# Patient Record
Sex: Female | Born: 1950 | Race: White | Smoking: Former smoker
Health system: Northeastern US, Academic
[De-identification: ages and names within clinical notes are randomized; demographics above are authoritative.]

## PROBLEM LIST (undated history)

## (undated) DIAGNOSIS — M1990 Unspecified osteoarthritis, unspecified site: Secondary | ICD-10-CM

## (undated) DIAGNOSIS — R32 Unspecified urinary incontinence: Secondary | ICD-10-CM

## (undated) DIAGNOSIS — J449 Chronic obstructive pulmonary disease, unspecified: Secondary | ICD-10-CM

## (undated) DIAGNOSIS — K219 Gastro-esophageal reflux disease without esophagitis: Secondary | ICD-10-CM

## (undated) DIAGNOSIS — J45909 Unspecified asthma, uncomplicated: Secondary | ICD-10-CM

## (undated) HISTORY — DX: Unspecified osteoarthritis, unspecified site: M19.90

## (undated) HISTORY — PX: OTHER SURGICAL HISTORY: SHX169

## (undated) HISTORY — PX: TUBAL LIGATION: SHX77

## (undated) HISTORY — PX: DENTAL SURGERY: SHX609

## (undated) HISTORY — PX: KNEE SURGERY: SHX244

## (undated) HISTORY — DX: Gastro-esophageal reflux disease without esophagitis: K21.9

## (undated) HISTORY — DX: Unspecified urinary incontinence: R32

## (undated) HISTORY — PX: TONSILLECTOMY: SHX28A

## (undated) HISTORY — PX: LUNG REMOVAL, PARTIAL: SHX233

---

## 2009-09-18 HISTORY — PX: FOOT SURGERY: SHX648

## 2011-09-19 HISTORY — PX: ANKLE SURGERY: SHX546

## 2016-03-13 ENCOUNTER — Other Ambulatory Visit: Payer: Self-pay | Admitting: Gastroenterology

## 2016-03-16 ENCOUNTER — Encounter: Payer: Self-pay | Admitting: Gastroenterology

## 2016-03-16 ENCOUNTER — Telehealth: Payer: Self-pay | Admitting: Pulmonology

## 2016-03-16 NOTE — Telephone Encounter (Signed)
Forwarding to provider to advise if he will see new patient.

## 2016-03-16 NOTE — Telephone Encounter (Signed)
The office of Dr. Rennis HardingEllis is referring  Ms. Romanello for a  abnormal CT scan. Dr. Rennis HardingEllis is requesting the Patient see Dr.Levy.  The patient has been [facility] the [facility] number and [facility] fax number to give to the referring PCP office.  [facility].     The patient's medical records     Will be faxed? yes                                                                                                                                    Dr. Rennis HardingEllis' office can be reached if necessary at [phone].  Patient can be reached at [phone].    REGISTRATION INSURANCE MANAGEMENT  This registration/insurance needs to be updated for the Pulmonary  department.  Please call the patient at [phone] .

## 2016-03-17 ENCOUNTER — Telehealth: Payer: Self-pay

## 2016-03-17 NOTE — Telephone Encounter (Signed)
[facility] will fax report for only chest Xray and share on [facility].

## 2016-03-20 LAB — HM DEXA SCAN

## 2016-03-20 NOTE — Telephone Encounter (Signed)
Patient confirmed NPV on 04/04/16 at 11:00 am, with Dr. Shawnee KnappLevy.

## 2016-03-22 ENCOUNTER — Telehealth: Payer: Self-pay

## 2016-03-22 NOTE — Telephone Encounter (Deleted)
Medical records received from [facility] and sent to scanning.

## 2016-03-22 NOTE — Telephone Encounter (Signed)
Medical records received from [facility] and sent to scanning.

## 2016-03-24 NOTE — Telephone Encounter (Signed)
Mailed new patient paperwork to the patient.

## 2016-03-29 LAB — LIPID PANEL
Chol/HDL Ratio: 3.3
Cholesterol: 239 mg/dL — ABNORMAL HIGH
HDL: 72 mg/dL — ABNORMAL HIGH
LDL Calculated: 142 mg/dL — ABNORMAL HIGH
Non HDL Cholesterol: 167 mg/dL — ABNORMAL HIGH
Triglycerides: 123 mg/dL

## 2016-03-29 LAB — HEMOGLOBIN A1C: Hemoglobin A1C: 5.9 %

## 2016-04-03 ENCOUNTER — Other Ambulatory Visit
Admission: RE | Admit: 2016-04-03 | Discharge: 2016-04-03 | Disposition: A | Discharge status: Home / Self Care | Payer: Self-pay | Source: Ambulatory Visit | Attending: Gastroenterology | Admitting: Gastroenterology

## 2016-04-03 ENCOUNTER — Encounter: Payer: Self-pay | Admitting: Gastroenterology

## 2016-04-04 ENCOUNTER — Ambulatory Visit: Payer: Self-pay | Admitting: Pulmonology

## 2016-04-04 ENCOUNTER — Telehealth: Payer: Self-pay

## 2016-04-04 ENCOUNTER — Other Ambulatory Visit: Payer: Self-pay

## 2016-04-04 ENCOUNTER — Ambulatory Visit: Payer: Self-pay

## 2016-04-04 VITALS — BP 138/79 | HR 94 | Temp 97.5°F | Resp 18 | Ht 61.0 in | Wt 181.0 lb

## 2016-04-04 DIAGNOSIS — J449 Chronic obstructive pulmonary disease, unspecified: Secondary | ICD-10-CM

## 2016-04-04 DIAGNOSIS — R059 Cough, unspecified: Secondary | ICD-10-CM

## 2016-04-04 NOTE — Telephone Encounter (Signed)
Patient is scheduled for [facility] on Thursday April 13, 2016 at [facility].  [facility]

## 2016-04-04 NOTE — Patient Instructions (Addendum)
We should get a Chest CT to evaluate the cystic area of your lungs.    Your breathing tests are normal    You can stop the spiriva and use albuterol as needed for the time being.    I will call you---[phone]---once I have reviewed the CT and we can talk about next steps.      Appointment 2 weeks following CT of the chest so we can review together    I will mention your PPD to Dr. Rennis HardingEllis regarding treatment options.

## 2016-04-05 NOTE — Progress Notes (Signed)
Patient:  Brianna Barnett  MRN:  [id number]    Pulmonary Clinic Note:   Hetty Ely, M.D.    Dear Dr. Rennis Harding:    Thank you for asking me to evaluate Brianna Barnett who was interviewed and examined in the Pulmonary Consultative Clinic on April 04, 2016.  As you know she is a 65 year old woman who has generally had good health.  Over the past six months she recalls having episodes of bronchitis three of four times.  She was on antibiotics, inhalers and even a course of Prednisone.  These were discreet episodes all piled up in the past six months that eventually led to obtaining a chest x-ray.  She was found to have a grapefruit size cystic area at the base of her right lung which prompted consultation.  She does not have a prior history of recurrent pneumonia or infections over the past ten years.  Even in infancy there were no special health problems.  She is not sure about fever but does report recent night sweats.  Her weight has generally gone up and down but over the past five years or so she has picked-up a good 15 pounds or more.  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  Her grandmother died of Tuberculosis.  She recalls being segregated from her classroom when she was a child for a brief period of time but has never received treatment for latent infection.      Her past medical history is notable for heartburn and reflux type symptoms.  In fact, she just underwent endoscopy and was found to have some level of stricture that required dilatation.  There is no history of hypertension, asthma, COPD or significant medical disease.  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  No major environmental or occupational exposures.  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  She smoked cigarettes at the rate of about a pack a day for 30 years.  She quit 16 years ago.  She now lives alone.    Her family history shows that her mother had arthritis, COPD and heart disease.  No other significant family history was reported.    Her review of systems shows her recent weight gain over the past five years or more.  Night sweats to the point of having to change her gown.  She has some dyspnea and cough.  She also reports some calf pain on walking.  Otherwise her review of systems is generally unremarkable.  She reported some level of chest discomfort associated with the bronchitis episodes.    On my exam her blood pressure measured 138/79 with a resting heart rate of 94 and an oxygen saturation of 95% on room air.  Her weight today was 181 pounds and her BMI 34.3.  She was afebrile.  Her head and neck exam showed no scleral icterus or injection.  No sinus symptoms were reported as far as pain or pressure.  She had no cervical or supraclavicular adenopathy.  Her lungs showed good breath sounds bilaterally.  She was pretty symmetric both right and left.  No wheezing, rhonchi or rales.  Cardiac tones were regular.  S2 was not increased.  Her JVP was normal.  She had no abdominal distention or lower extremity swelling.  She had no joint deformities, rash or clubbing.    Her spirometry shows a very mildly reduced FEV1 at 1.6 liters (76% of predicted).  Her vital capacity was 80% of predicted at 2.22 liters.  The FEV1/FVC ratio was normal at 72%.  These studies are essentially normal except for the mild reduction in her FEV1.  No clear evidence for obstructive or restrictive disease.  Her low/ normal vital capacity may be tied in with her obesity/habitus.    I reviewed her chest x-ray from 03/13/16 but importantly also found an old chest x-ray from 01/06/11.  The cystic area at the base of her right lung which is about the size of a grapefruit was present back in 2012 but  is much larger now.  The film from 2012 was interpreted as possibly showing an accessory fissure but clearly in retrospect the cystic area was present five years ago.  It was about the size of a peach at that time.  There are no other parenchymal abnormalities, no effusions.  No hilar adenopathy or mass.       In summary Brianna Barnett is a 65 year old woman with a six month history of recurrent cough and bronchitis type symptoms.  Her chest x-ray shows a very thin-walled cystic structure at the base of her right lung that has expanded significantly over the past five years.  This has an appearance of a pneumatocele but given its location at the lower pole of the right hilum it is also possible that this is tied in with a bronchogenic cyst or perhaps post-obstructive emphysema from endobronchial disease.  Since it has been present for five years with cystic dilatation but no evidence of mass I think it is unlikely that we are dealing with an endobronchial carcinoid/carcinoid tumor or for that matter a primary pulmonary malignancy.  Likely this might be congenital or an expanding pneumatocele/ball-valve phenomenon.      To better define things and decide on further management she should have a CAT scan performed.  She may need bronchoscopy as well.  Very likely this should be removed given the four episodes of severe bronchitis over the past several months.  There is also the risk that this could rupture and cause pneumothorax.      Ill arrange for a chest CT scan and have her back in the office for review.  Ill also review this situation with our thoracic group as likely this will need to be surgically approached.  Thank you for involving me in her care.  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  Since she never received treatment for latent infection she would be a good candidate for nine months of Isoniazid or four months of Rifampin.  We can address this down the road.  Usually  this is most easily managed by having her go to the [facility] as all of the medications would be free as well as any monitoring for side effects.     Sincerely,        Vic RipperPaul C. Shawnee KnappLevy, M.D.

## 2016-04-10 LAB — SURGICAL PATHOLOGY

## 2016-04-18 DIAGNOSIS — R7611 Nonspecific reaction to tuberculin skin test without active tuberculosis: Secondary | ICD-10-CM

## 2016-04-18 HISTORY — DX: Nonspecific reaction to tuberculin skin test without active tuberculosis: R76.11

## 2016-05-02 ENCOUNTER — Ambulatory Visit: Payer: Self-pay

## 2016-05-02 ENCOUNTER — Ambulatory Visit: Payer: Self-pay | Admitting: Pulmonology

## 2016-05-02 ENCOUNTER — Encounter: Payer: Self-pay | Admitting: Pulmonology

## 2016-05-02 VITALS — BP 141/78 | HR 96 | Resp 16 | Ht 62.0 in | Wt 182.5 lb

## 2016-05-02 DIAGNOSIS — J984 Other disorders of lung: Secondary | ICD-10-CM

## 2016-05-02 DIAGNOSIS — R7611 Nonspecific reaction to tuberculin skin test without active tuberculosis: Secondary | ICD-10-CM

## 2016-05-02 NOTE — Patient Instructions (Signed)
I will call Dr. Yetta BarreJones re: appt.    Avoid air travel if possible until pneumatocele is removed.      Should you wish treatment for your +PPD you can [facility].    Follow up as needed.

## 2016-05-03 NOTE — Progress Notes (Signed)
Patient:  Brianna Barnett  MRN:  [id number]    Pulmonary Clinic Note:   Hetty ElyJosephine Ellis, M.D.    Dear Dr. Rennis HardingEllis:    Brianna Braveheresa Pile was re-evaluated in the Pulmonary Clinic on May 02, 2016.  She had the follow-up chest CT scan to better define the cystic lesion in her right lower lobe.  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  Although she doesnt have the classic pneumonia that preceded its development it certainly has that appearance on imaging studies.  I doubt we are dealing with a bronchogenic cyst.    On my exam her vital signs are listed in the electronic record and are normal.  Her lungs are clear.    I reviewed the CT scan with Brianna Barnett and her daughter.  I think there is very little likelihood that we are dealing with an endobronchial lesion that might have caused a ball valve effect.  Ive told Brianna Barnett that I would be referring her to Dr. Barrett Henlearolyn Valeria to be considered for surgical resection.  If necessary, bronchoscopy can be done at the time of her operative procedure.    Lastly, Brianna Barnett asked me about her prior history of a positive PPD.  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  Ive suggested that if she is interested in receiving treatment for latent infection, she would be referred [facility] as the medication and its monitoring could be done as part of the [facility] TB Program.    I did not schedule her for a follow-up visit but would be happy to see her down the road if issues come up.  Ive reassured her that I do not think any of this is tied in with an underlying growth in the lung or endobronchial lesion but because of the risks of the enlarging pneumatocele that was present back in 2012 it would make sense to get this removed.  I also advised her to avoid air travel for the time being until this is dealt with.    Sincerely,        Vic RipperPaul C. Shawnee KnappLevy, M.D.

## 2016-05-04 ENCOUNTER — Ambulatory Visit: Payer: Self-pay | Admitting: Thoracic/Foregut Surgery

## 2016-05-10 ENCOUNTER — Ambulatory Visit: Payer: Self-pay | Admitting: Thoracic/Foregut Surgery

## 2016-05-10 ENCOUNTER — Encounter: Payer: Self-pay | Admitting: Thoracic/Foregut Surgery

## 2016-05-10 VITALS — BP 157/80 | HR 98 | Temp 97.1°F | Resp 19 | Ht 62.0 in | Wt 185.0 lb

## 2016-05-10 DIAGNOSIS — J984 Other disorders of lung: Secondary | ICD-10-CM

## 2016-05-10 NOTE — Preop H&P (Signed)
]      ROS ] 

## 2016-05-10 NOTE — H&P (Addendum)
05/10/2016    RE: Brianna Barnett  DOB: 09/29/1950  MRN: [id number]    Dear Drs. Marcelino ScotEllis and Levy,      Today, I had the pleasure of meeting Brianna Barnett in the Thoracic and Foregut surgery clinic in consultation for cystic lesion of the right lung.      As you are aware,  Brianna Barnett is a 65 y.o. female with a 30 pack year smoking history that ended 15 years ago, arthritis, GERD and history of numerous episodes of pneumonia and bronchitis who was referred to our clinic for CT finding of a cystic structure in the right lower lobe concerning for bullous. Per Brianna Barnett, Brianna Barnett has had three bouts of bronchitis in the last 6 months for which Brianna Barnett was found to have a right lower lobe cyst on CXR.  Brianna Barnett was then referred to Dr. Shawnee KnappLevy, CT of the chest was performed and it showed a over 10 cm cyst in the right lower lobe.     Brianna Barnett endorses subjective shortness of breath in the last several months, often has productive cough in this time period. Brianna Barnett denies hemoptysis and wheezing.       PAST MEDICAL  HISTORY:  Past Medical History:   Diagnosis Date    Arthritis     GERD (gastroesophageal reflux disease)        PAST SURGERY HISTORY:  Past Surgical History:   Procedure Laterality Date    EYE SURGERY      KNEE SURGERY         ALLERGIES AND MEDICATIONS  Allergies   Allergen Reactions    Cat Dander Shortness Of Breath and Itching    Sulfa Antibiotics Hives, Itching and Nausea And Vomiting       Current Outpatient Prescriptions   Medication    PROAIR HFA 108 (90 BASE) MCG/ACT inhaler    amitriptyline (ELAVIL) 50 MG tablet     No current facility-administered medications for this visit.        SOCIAL HISTORY  Social History     Social History    Marital status: Divorced     Spouse name: N/A    Number of children: N/A    Years of education: N/A     Social History Main Topics    Smoking status: Former Smoker     Packs/day: 0.75     Years: 37.00     Types: Cigarettes    Smokeless tobacco: Never Used    Alcohol use None      Drug use: None    Sexual activity: Not Asked     Other Topics Concern    None     Social History Narrative       FAMILY HISTORY  No family history on file.    REVIEW OF SYSTEMS:  Review of Systems   Constitutional: Negative for chills, diaphoresis, fever, malaise/fatigue and weight loss.   HENT: Negative.    Eyes: Negative.    Respiratory: Positive for cough, sputum production and shortness of breath. Negative for hemoptysis and wheezing.    Cardiovascular: Negative.    Gastrointestinal: Negative.    Genitourinary: Negative.    Musculoskeletal: Negative.    Skin: Negative.    Neurological: Negative for weakness.   Psychiatric/Behavioral: Negative.           PHYSICAL EXAM:   height is 1.575 m (5\' 2" ) and weight is 83.9 kg (185 lb). Brianna Barnett temporal temperature is 36.2 C (97.1 F). Brianna Barnett blood pressure is 157/80 and  Brianna Barnett pulse is 98. Brianna Barnett respiration is 19 and oxygen saturation is 96%.   Pain Score:0    Brianna Barnett is alert and oriented. Brianna Barnett face is symmetric, sclerae anicteric, and Brianna Barnett pupils are equal, round, reactive to light bilaterally.  Brianna Barnett oropharyngeal mucosa is pink and moist.  Brianna Barnett has no palpable cervical or supraclavicular lymphadenopathy.  Brianna Barnett lungs are clear to auscultation bilaterally, without any rhonchi, rales, or wheezes.  Brianna Barnett heart rate and rhythm are regular, without any murmurs.  Brianna Barnett abdomen is soft and nondistended, without any obvious hepatosplenomegaly.  Brianna Barnett lower extremities are warm and without any edema.       RECORD REVIEW/IMAGING:  Chest CT on 04/13/16    FINDINGS:  There is a large right lower lobe pneumatocele measuring   8.7 cm on image 63. Small scattered bullae are seen within the upper   lung zones. There are no pleural effusions.    There are no enlarged mediastinal or axillary lymph nodes. There is   atherosclerotic disease of the thoracic aorta.    Scans of the visualized upper abdominal contents are unremarkable.    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    PFTs            IMPRESSION/PLAN  In summary, Brianna Barnett is a 65 y.o. female with past history of 30 pack year smoking, arthritis, GERD who was referred to my clinic for a large bulla in the right lower lobe in the setting of multiple episodes of bronchitis in the last few months.  The bulla has grown in size compared to the chest film from 2012.  Upon reviewing of Brianna Barnett CT scan from July, the patient has one other bulla in the right upper lobe. 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      I have discussed the option of surgical resection of the bullae via a VATS.  I reviewed the risks and recovery of surgery.   We would need a complete set of PFTs and will see the patient back after those studies    Thank you for the referral. Should you have any questions, please feel free to contact me at any time.      I saw and evaluated the patient. I have reviewed and edited the resident's/fellow's note and confirm the findings and plan of care as documented above.    Barrett HenleAROLYN [place], MD      Respectfully yours,    Barrett Henlearolyn [place], M.D.  Associate Professor of Surgery  Division of Thoracic / Foregut Surgery  [facility]    [address]  [place], [place] [id number]  Office: [phone]  Fax: [phone]

## 2016-05-11 NOTE — Invasive Procedure Plan of Care (Signed)
Invasive Procedure Plan of Care (Consent Form 419):   Condition(s) Addressed: Right Bullae of lung   Person Performing Procedure: [place], Sabel Hornbeck   Side: Right    Procedure: Bronchoscopy, right VATS wedge resection    Special Equipment:    Planned Anesthesia: General   Benefits: Removal of bullae   Risks: Infection, bleeding, damage to adjacent structures, air leak, risks of anesthesia     Alternatives: No procedure   Expected Length of Stay: 1 day(s)   Pt Decision: Agrees to proceed     ----------------------------------------------------------------------------------------------------------------------------------------  Consent:  I hereby give my consent and authorize [place], Sunny Gains  (The list of possible assistants, all of whom are privileged to provide surgical services at the hospital, is available)  To treat the following: Right Bullae of lung  Procedure includes: Bronchoscopy, right VATS wedge resection   1 The care provider has explained my condition to me, the benefits of having the above treatment procedure, and alternate ways of treating my condition. I understand that no guarantees have been made to me about the result of the treatment. The alternatives to this procedure include: No procedure   2 The care provider has discussed with me the reasonably foreseeable risks of the treatment and that there may be undesirable results. The risks that are specifically related to this procedure include: Infection, bleeding, damage to adjacent structures, air leak, risks of anesthesia     3 I understand that during the treatment a condition may be discovered which was not known before the treatment started. Therefore, I authorize the care provider to perform any additional or different treatment which is thought necessary and available.   4 Any tissue, parts, or substances removed during the procedure may be retained or disposed of in accordance with customary scientific, educational and clinical practice.   5  Vendor information if appropriate:      6 Patient Consent for Medical or Surgical Procedure: I have carefully read and fully understand this informed consent form, and have had sufficient opportunity to discuss my condition and the above procedure(s) with the care provider and his/her associates, and all of my questions have been answered to my satisfaction.    Agrees to proceed       ____________________________________________________   _______________   Signature of Patient  Date/Time     ____________________________________________________   _______________   Signature of Parent or Legal Guardian  (if Patent is unable to sign or is a minor) Date/Time       Complete this section for all OR procedures and all other invasive internal procedures performed in any setting.  7 Consent for Receipt of Tissue(s): Not expected to be needed but may be required and given in an emergency   8 For those procedures that have the potential for significant blood loss: transfusion is not expected to be needed but may be required and given in an emergency, I consent to the transfusion of blood or blood components that may be necessary before, during or after the procedure. I have been informed that no transfusion is 100% safe, however present testing methods make the risks of infection very small. Risks include infection from viruses, bacteria, or parasites, including but not limited to HIV (the AIDS virus) and hepatitis, as well as fever, chills, allergy, volume overload, or death. I have discussed possible alternatives with my care provider, including no transfusion, autologous transfusion (donation of my own blood), designated/directed donor transfusion (collection of blood from donors selected by me) or blood salvage  during the procedure. I understand that these alternatives may not be available due to timing or health reasons, and the above risks may still apply.   9 Patient Consent for Blood/Tissue: I have had a chance to  discuss the risks, benefits and alternatives regarding transfusion/receipt of tissue (as above) with my healthcare provider. My decision(s) regarding the transfusion of blood or blood components and/or the receipt of tissue are as above. I understand this covers my perioperative/periprocedural (before, during, and after the surgery/procedure) course of treatment.    Patient sign here unless 7 and 8 do not apply.       ____________________________________________________   _______________   Signature of Patient Date/Time     ____________________________________________________   _______________   Signature of Parent or Legal Guardian  (if Patent is unable to sign or is a minor) Date/Time      I have discussed the planned procedure, including the potential for any transfusion of blood products or receipt of tissue as necessary, expected benefits, the potential complications and risks and possible alternatives and their benefits and risks with the patient or the patient's surrogate. In my opinion, the patent or the patient's surrogate understands the proposed procedure, its risks, benefits, and alternatives.    Electronically signed by Barrett Henle, MD at 1:46 PM

## 2016-05-29 ENCOUNTER — Ambulatory Visit: Payer: Self-pay

## 2016-05-29 DIAGNOSIS — J984 Other disorders of lung: Secondary | ICD-10-CM

## 2016-06-02 ENCOUNTER — Encounter: Payer: Self-pay | Admitting: Thoracic/Foregut Surgery

## 2016-06-06 ENCOUNTER — Encounter (INDEPENDENT_AMBULATORY_CARE_PROVIDER_SITE_OTHER): Payer: Self-pay

## 2016-06-06 ENCOUNTER — Encounter: Payer: Self-pay | Admitting: Nurse Practitioner

## 2016-06-06 DIAGNOSIS — J984 Other disorders of lung: Secondary | ICD-10-CM

## 2016-06-06 DIAGNOSIS — K219 Gastro-esophageal reflux disease without esophagitis: Secondary | ICD-10-CM | POA: Insufficient documentation

## 2016-06-06 DIAGNOSIS — M1990 Unspecified osteoarthritis, unspecified site: Secondary | ICD-10-CM | POA: Insufficient documentation

## 2016-06-06 HISTORY — DX: Other disorders of lung: J98.4

## 2016-06-06 NOTE — Anesthesia Preprocedure Evaluation (Addendum)
Anesthesia Pre-operative History and Physical for Brianna Barnett    ______________________________________________________________________________________  History and Physical Completed at [facility]  Summary:  Brianna Barnett presents preoperatively for anesthesia evaluation prior to THORACOSCOPY (VATS) Wedge (Right ) on 06/16/16 with Dr. Ronnald Ramp.   She has a medical history significant for:  other pulmonary insufficiency - followed by Dr. Hartford Poli (note in e-records from 04/04/16, letter 05/02/16).   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 Mitral sclerosis.    Nuclear stress 02/13/16:   1. Myocardial perfusion imaging reveals normal rest and stress images. No evidence of ischemia.  2. No chest pain or shortness of breath noted with pharmacologic stress.   3. Normal EKG response to pharmocologic stress. No evidence of ischemia. No arrhythmias.   4. Normal LV cavity size and systolic function. The calculated LVEF 74%.  5. Normal TID index.   Electrophysiology/AICD/Pacer:  EKG 06/07/16: sinus rhythm. Left axis deviation. Rate 93, PR 172.  Pulmonary Function Tests:  PFT's 05/29/16 : FEV1 1.79 (78 % Pred); FVC 2.46 (81 % Pred), FEV1/FVC 73 % (95 % Pred), DLCO 16.08 (76 % Pred). Normal pulmonary function tests. There has been no significant change from PFTs performed on 04/04/16.    By Pleas Koch, NP at 11:08 AM on 06/06/2016    <[email or web address]>  Anesthesia Evaluation Information Source: patient, records     ANESTHESIA  Pertinent(-):  history of anesthetic complications, Family Hx of Anesthetic  Complications    GENERAL    + Obesity (BMI 32.8)            central    + Contact Precautions (GM died of TB - pt has had no treatment - CXR negative)           TB  Pertinent (-):  substance abuse, history of anesthetic complications, Family Hx of Anesthetic Complications    HEENT    + Corrective Eyewear (reading, driving)            glasses  Pertinent (-):   hearing loss, TMJD, sinus issues, nosebleeds, neck pain  Comment: Detached retina OD in the past with repair   PULMONARY    + Smoker            former, remote history    + Pulmonary Testing    + Restrictive Lung Disease (bronchitis x 4 within year)    + Shortness of breath (+ SOB with 1 FOS - walking 1 mile per day)    + Pneumonia  Pertinent(-): recent URI, cough/congestion, environmental allergies, snoring    CARDIOVASCULAR  Good(4+METs) Exercise Tolerance    + Hypertension             well controlled           + Cardiac Testing (negative per pt)            nuclear stress test    + Dysrhythmias            palpitations, symptomatic  Pertinent(-):  past MI, angina, CAD, anticoagulants, CHF,  orthopnea (2 pillows at night and bed on an incline d/t GERD), DVT    GI/HEPATIC/RENAL  Last PO Intake: >8hr before procedure and >2hr before procedure (clears)    + GERD (on omeprazole)            well controlled    + Alcohol use            social  Pertinent(-):  hiatal hernia, PUD (GIB in 30s), nausea, vomiting, liver  issues, pancreatic issues, bowel issues, urinary issues NEURO/PSYCH    + Chronic pain (left ankle and knee)    + Psychiatric Issues (on elavil at hs)          anxiety  Pertinent(-):  headaches (when younger), dizziness/motion sickness, syncope, seizures, cerebrovascular event, gait/mobility issues    ENDO/OTHER  Pertinent(-):  diabetes mellitus, thyroid disease, chemo Hx, menstruating (post-menopause)    HEMALOGIC     Denies hematologic issues    + Arthritis (left knee)            knees and ankle/feet  Pertinent(-):  bruises/bleeds easily, coagulopathy,  anticoagulants, blood transfusion, blood dyscrasia, autoimmune disease       Physical Exam    Airway            Mouth opening: normal            Mallampati: II            TM distance (fb): >3 FB            Neck ROM: full  Dental        Cardiovascular  Normal Exam           Rhythm: regular           Rate: normal  No murmur    Neurologic    Normal Exam    General Survey    Normal Exam   Pulmonary   Normal Exam    breath sounds clear to auscultation    + Decreased breath sounds  Bilateral    No cough, rhonchi, wheezes, rales    Mental Status   Normal Exam    oriented to person, place and time     Patient Education from CPM Provider:  The following items were discussed with Brianna Barnett to his satisfaction and comprehension:  The facility's NPO guidelines were discussed  To call the surgeon if he becomes ill prior to surgery  All questions were answered  Transportation home: daughter  Medications DOS with sip of water: as per AVS  Hold medications AM day of surgery: as per AVS  Nurse reviewed additional items as indicated in the education record.  Brianna Barnett verbalized knowledge and teaching objectives met.  No barriers to learning identified.  Teaching sheet reviewed with patient/family.  IV insertion was reviewed with her.  The importance of coughing and deep breathing was emphasized.  She was instructed on the pain scale and pain management.  Brianna Barnett was instructed to continue ASA/NSAIDS before surgery.      ________________________________________________________________________  Plan  ASA Score  2  Anesthetic Plan general    Induction (RSI); General Anesthesia/Sedation Maintenance Plan (inhaled agents, propofol infusion and neuromuscular blockade);  Airway Manipulation (direct laryngoscopy); Airway (double lumen tube); Line ( use current access); Monitoring (standard ASA); Positioning (lateral decubitus); PONV Plan (dexamethasone and ondansetron); Pain (per surgical team); PostOp  (PACU)    Informed Consent     Risks:          Risks discussed were commensurate  with the plan listed above with the following specific points: N/V, aspiration and sore throat , damage to:(eyes, nerves, teeth), awareness, unexpected serious injury, allergic Rx, death    Anesthetic Consent:      Anesthetic plan (and risks as noted above) were discussed with patient    Blood products Consent:        Use of blood products discussed with: patient     Plan also discussed with team members including:  attending and resident    Attending Attestation:  As the primary attending anesthesiologist, I attest that the patient or proxy understands and accepts the risks and benefits of the anesthesia plan. I also attest that I have personally performed a pre-anesthetic examination and evaluation, and prescribed the anesthetic plan for this particular location within 48 hours prior to the anesthetic as documented. Nikai Quest Marilynn Rail, MD 8:24 AM

## 2016-06-07 ENCOUNTER — Ambulatory Visit
Admission: RE | Admit: 2016-06-07 | Discharge: 2016-06-07 | Disposition: A | Discharge status: Home / Self Care | Payer: Self-pay | Source: Ambulatory Visit | Attending: Thoracic/Foregut Surgery | Admitting: Thoracic/Foregut Surgery

## 2016-06-07 ENCOUNTER — Other Ambulatory Visit: Payer: Self-pay | Admitting: Gastroenterology

## 2016-06-07 ENCOUNTER — Encounter: Payer: Self-pay | Admitting: Nurse Practitioner

## 2016-06-07 DIAGNOSIS — J984 Other disorders of lung: Secondary | ICD-10-CM

## 2016-06-07 DIAGNOSIS — K219 Gastro-esophageal reflux disease without esophagitis: Secondary | ICD-10-CM

## 2016-06-07 DIAGNOSIS — M1990 Unspecified osteoarthritis, unspecified site: Secondary | ICD-10-CM

## 2016-06-07 LAB — COMPREHENSIVE METABOLIC PANEL
ALT: 27 U/L (ref 0–35)
AST: 27 U/L (ref 0–35)
Albumin: 4.1 g/dL (ref 3.5–5.2)
Alk Phos: 94 U/L (ref 35–105)
Anion Gap: 14 (ref 7–16)
Bilirubin,Total: 0.3 mg/dL (ref 0.0–1.2)
CO2: 26 mmol/L (ref 20–28)
Calcium: 9.3 mg/dL (ref 8.6–10.2)
Chloride: 103 mmol/L (ref 96–108)
Creatinine: 0.82 mg/dL (ref 0.51–0.95)
GFR,Black: 86 *
GFR,Caucasian: 75 *
Glucose: 80 mg/dL (ref 60–99)
Lab: 12 mg/dL (ref 6–20)
Potassium: 4.6 mmol/L (ref 3.3–5.1)
Sodium: 143 mmol/L (ref 133–145)
Total Protein: 6.9 g/dL (ref 6.3–7.7)

## 2016-06-07 LAB — PROTIME-INR
INR: 1 (ref 0.9–1.1)
Protime: 10.9 s (ref 10.0–12.9)

## 2016-06-07 LAB — CBC AND DIFFERENTIAL
Baso # K/uL: 0 10*3/uL (ref 0.0–0.1)
Basophil %: 0.5 %
Eos # K/uL: 0.1 10*3/uL (ref 0.0–0.4)
Eosinophil %: 2 %
Hematocrit: 40 % (ref 34–45)
Hemoglobin: 13 g/dL (ref 11.2–15.7)
IMM Granulocytes #: 0 10*3/uL (ref 0.0–0.1)
IMM Granulocytes: 0.3 %
Lymph # K/uL: 2.4 10*3/uL (ref 1.2–3.7)
Lymphocyte %: 36.8 %
MCH: 28 pg/cell (ref 26–32)
MCHC: 33 g/dL (ref 32–36)
MCV: 84 fL (ref 79–95)
Mono # K/uL: 0.4 10*3/uL (ref 0.2–0.9)
Monocyte %: 5.8 %
Neut # K/uL: 3.6 10*3/uL (ref 1.6–6.1)
Nucl RBC # K/uL: 0 10*3/uL (ref 0.0–0.0)
Nucl RBC %: 0 /100 WBC (ref 0.0–0.2)
Platelets: 268 10*3/uL (ref 160–370)
RBC: 4.7 MIL/uL (ref 3.9–5.2)
RDW: 14.7 % — ABNORMAL HIGH (ref 11.7–14.4)
Seg Neut %: 54.6 %
WBC: 6.6 10*3/uL (ref 4.0–10.0)

## 2016-06-07 LAB — TYPE AND SCREEN
ABO RH Blood Type: O POS
Antibody Screen: NEGATIVE

## 2016-06-07 LAB — APTT: aPTT: 26.8 s (ref 25.8–37.9)

## 2016-06-07 LAB — PATIENT CONSENT

## 2016-06-07 NOTE — Telephone Encounter (Signed)
I called Brianna Cosierheresa to ask her more about any family history of blood clotting disorders. She states that she thought her brother was tested for some type of clotting disorder when he had his blood clot and she thought the test was positive. She could not remember the name of the disorder but when I mentioned Factor V Leiden, she said that sounded familiar. She will try to get a hold of her sister-in-law to ask more specifically. She is seeing Brianna Almasianne Smith, NP for PRAT this AM. I called Brianna Barnett and asked if Factor V Leiden could be added to her pre-op labs or if there were any other tests she would recommend. She kindly agreed to add Factor V Leiden testing in addition to the protime-INR and aPTT.

## 2016-06-07 NOTE — Discharge Instructions (Signed)
[facility] Preoperative Instructions            Patient Name: Brianna Barnett  Surgery Date:  Friday, September 29        When to Arrive for Surgery         On the day prior to your surgery, Thursday, September 28, you will find out your arrival time. [facility] - Please call [phone] between 2:30 and 7:00 p.m. .        Note: Patients scheduled for a procedure on Monday will be assigned an arrival time on the Friday before. Please note surgery start time is approximate. You may want to bring something to help pass the time.        PLEASE ARRIVE ON TIME.        Directions to Surgical Center        [facility]: On the day of your procedure, park in the parking garage and take the elevator/stairs to Level One (1), then follow the walkway to the Main Lobby.  Walk past the Information Desk in the lobby, towards the Lab & Outpatient Services.  Follow the GREEN (R) ceiling tags to the GREEN elevators. (Valet parking is available outside the front entrance of the hospital between 6:00 AM and 5:00 PM and assistance is available at the information desk, if needed).   Continue to the [facility](B-Level): Take the GREEN (G) elevators to the Basement (Level B - Two floors down) to the [facility] and check in with the receptionist at the desk.        Eating Guidelines        Follow the instructions below unless otherwise instructed by your physician.        No solid food AFTER MIDNIGHT on the day of your surgery. No candy, gum, mints or chewing tobacco.        You can have clear liquids up to 4 hours before your surgery. This includes water, apple juice,  clear carbonated beverages,  black coffee,  clear tea. No milk, cream, or non-dairy creamers.         Failure to follow these instructions, could lead to a delay or cancellation of your procedure.        Medication Guidelines        On the morning of surgery, take only the medications indicated on  the Preoperative Medication List below.        Medications should only be taken with no more than one ounce of water.        You may continue taking any non-steroidal anti-inflammatory agents such as Ibuprofen (Advil, Motrin) or Naproxen (Aleve).        Additional Information        What to bring or wear:        Bring Photo ID and insurance information.        Eye glasses and/or hearing aids:  These may be removed prior to surgery so be prepared to leave them with a trusted family member.        Do NOT wear contact lenses.        Wear comfortable, loose fit clothing.          You must arrange a ride home before coming to surgery.        What NOT to bring or wear:        Before coming to the hospital, remove all makeup (including mascara), jewelry (including  wedding band and watch), hair accessories and nail polish from toes and fingers.  Do not bring any valuables (money, wallet, purse, jewelry, or contact lenses.)        Information for After Surgery:        Your family will be directed to a waiting area when you are taken to surgery.        We ask that only one or two family members accompany you on the day of your procedure.        No children under the age of 74 are allowed as visitors in the [facility].        Additional visitor restrictions are possible during influenza season.        Your family will be notified when your surgery is completed and you have arrived on the patient care unit.        Expected Length of Stay:        SDA Admission:  You are being admitted to the hospital after surgery. Please leave any luggage in your car until after your procedure.  Your family can bring this into the hospital once you are in your room.        Health standards require that a responsible adult must accompany any patient who has received anesthetics or sedation and is going home the same day. You must arrange a ride home before coming to surgery.        Questions:    Please call the [facility] at [phone] between 8:00 a.m. and 4:00 p.m. Monday through Friday. You were seen today by Wyona Almas, FNP-C    [phone].        Surgical Site Infections FAQs        What is a Surgical Site infection (SSI)?  A surgical site infection is an infection that occurs after surgery In the part of the body where the surgery took place. Most patients who have surgery do not develop an infection. However, Infections develop in about 1 to 3 out of every 100 patients who have surgery.         Some of the common symptoms of a surgical site infection are:    Redness and pain around the area where you had surgery    Drainage of cloudy fluid from your surgical wound    Fever         Can SSIs be treated?   Yes. Most surgical site infections can be treated with antibiotics. The antibiotic given to you depends on the bacteria (germs) causing the Infection. Sometimes patients with SSIs also need another surgery to treat the infection.         What are some of the things that hospitals are doing to prevent SSls?   To prevent SSIs, doctors, nurses, and other healthcare providers:    Clean their hands and arms up to their elbows with an antiseptic agent just before the surgery.    Clean their hands with soap and water or an alcohol-based hand rub before and after caring for each patient.    May remove some of your hair Immediately before your surgery using electric clippers If the hair Is in the same area where the procedure will occur. They should not shave you with a razor.    Wear special hair covers, masks, gowns, and gloves during surgery to keep the surgery area clean.    Give you antibiotics before your surgery starts. in most cases,  you should get antibiotics within 60 mInutes before the surgery starts and the antibiotics should be stopped within 24 hours after surgery.    Clean the skin at the site of your surgery with a special soap that kills germs.         What can I do to help prevent  SSIs?   Before your surgery:    Tell your doctor about other medical problems you may have. Health problems such as allergies, diabetes, and obesity could affect your surgery and your treatment.    Quit smoking. Patients who smoke get more Infections. Talk to your doctor about how you can quit before your surgery.    Do not shave near where you will have surgery. Shaving with a razor can Irritate your skin and make it easier to develop an infection.         At the time of your surgery:    Speak up if someone tries to shave you with a razor before surgery. Ask why you need to be shaved and talk with your surgeon if you have any concerns.    Ask if you will get antibiotics before surgery.         After your surgery:    Make sure that your healthcare providers clean their hands before examining you, either with soap and water or an alcohol-based hand rub.   If you do not see your healthcare providers wash their hands,   please ask them to do so.     Family and friends who visit you should not touch the surgical wound or dressings.    Family and friends should clean their hands with soap and water or an alcohol-based hand rub before and after visiting you. If you do not see them clean their hands, ask them to clean their hands.   What do I need to do when I go home from the hospital?    Before you go home, your doctor or nurse should explain everyt hing you need to know about taking care of your wound. Make sure you understand how to care for your wound before you leave the hospital.    Always clean your hands before and after caring for your wound.    Before you go home, make sure you know who to contact If you have questions or problems after you get home.    If you have any symptoms of an Infection, such as redness and pain at the surgery site, drainage, or fever, call your doctor immediately.   if you have additional questions, Please ask your doctor or nurse.

## 2016-06-08 LAB — EKG 12-LEAD: Rate: 93 {beats}/min

## 2016-06-14 LAB — FACTOR V LEIDEN: Genotype: NORMAL

## 2016-06-14 LAB — FVL REVIEW

## 2016-06-16 ENCOUNTER — Inpatient Hospital Stay
Admission: RE | Admit: 2016-06-16 | Disposition: A | Discharge status: Home / Self Care | Payer: Self-pay | Source: Ambulatory Visit | Attending: Thoracic/Foregut Surgery | Admitting: Thoracic/Foregut Surgery

## 2016-06-16 ENCOUNTER — Encounter
Admission: RE | Disposition: A | Discharge status: Home / Self Care | Payer: Self-pay | Source: Ambulatory Visit | Attending: Thoracic/Foregut Surgery

## 2016-06-16 HISTORY — PX: PR THORACOSCOPY W/THERA WEDGE RESEXN INITIAL UNILAT: 32666

## 2016-06-16 LAB — POCT GLUCOSE: Glucose POCT: 94 mg/dL (ref 60–99)

## 2016-06-16 SURGERY — VATS (VIDEO-ASSISTED THORACOSCOPIC SURGERY)
Anesthesia: General | Site: Chest | Laterality: Right | Wound class: Clean Contaminated

## 2016-06-16 MED ORDER — FENTANYL CITRATE 50 MCG/ML IJ SOLN *WRAPPED*
INTRAMUSCULAR | Status: AC
Start: 2016-06-16 — End: 2016-06-16
  Filled 2016-06-16: qty 2

## 2016-06-16 MED ORDER — ONDANSETRON HCL 2 MG/ML IV SOLN *I*
INTRAMUSCULAR | Status: AC
Start: 2016-06-16 — End: 2016-06-16
  Filled 2016-06-16: qty 2

## 2016-06-16 MED ORDER — HEPARIN SODIUM 5000 UNIT/ML SQ *I*
SUBCUTANEOUS | Status: AC
Start: 2016-06-16 — End: 2016-06-16
  Filled 2016-06-16: qty 1

## 2016-06-16 MED ORDER — KETOROLAC TROMETHAMINE 30 MG/ML IJ SOLN *I*
INTRAMUSCULAR | Status: AC
Start: 2016-06-16 — End: 2016-06-16
  Administered 2016-06-16: 15 mg via INTRAVENOUS
  Filled 2016-06-16: qty 1

## 2016-06-16 MED ORDER — DEXAMETHASONE SODIUM PHOSPHATE 4 MG/ML INJ SOLN *WRAPPED*
INTRAMUSCULAR | Status: DC | PRN
Start: 2016-06-16 — End: 2016-06-16
  Administered 2016-06-16: 8 mg via INTRAVENOUS

## 2016-06-16 MED ORDER — ROCURONIUM BROMIDE 10 MG/ML IV SOLN *WRAPPED*
Status: AC
Start: 2016-06-16 — End: 2016-06-16
  Filled 2016-06-16: qty 10

## 2016-06-16 MED ORDER — KETOROLAC TROMETHAMINE 30 MG/ML IJ SOLN *I*
15.0000 mg | Freq: Four times a day (QID) | INTRAMUSCULAR | Status: DC
Start: 2016-06-16 — End: 2016-06-16

## 2016-06-16 MED ORDER — MAGNESIUM HYDROXIDE 400 MG/5ML PO SUSP *I*
30.0000 mL | Freq: Every day | ORAL | Status: DC
Start: 2016-06-16 — End: 2016-06-19
  Administered 2016-06-16 – 2016-06-19 (×4): 30 mL via ORAL
  Filled 2016-06-16 (×4): qty 30

## 2016-06-16 MED ORDER — ROCURONIUM BROMIDE 10 MG/ML IV SOLN *WRAPPED*
Status: DC | PRN
Start: 2016-06-16 — End: 2016-06-16
  Administered 2016-06-16 (×2): 20 mg via INTRAVENOUS
  Administered 2016-06-16: 10 mg via INTRAVENOUS
  Administered 2016-06-16: 80 mg via INTRAVENOUS

## 2016-06-16 MED ORDER — HYDROMORPHONE PCA 1 MG/ML *WRAPPED*
INTRAMUSCULAR | Status: DC
Start: 2016-06-16 — End: 2016-06-17

## 2016-06-16 MED ORDER — SUGAMMADEX SODIUM 100 MG/1ML IV SOLN *WRAPPED*
INTRAVENOUS | Status: DC | PRN
Start: 2016-06-16 — End: 2016-06-16
  Administered 2016-06-16: 100 mg via INTRAVENOUS

## 2016-06-16 MED ORDER — ALBUTEROL SULFATE (2.5 MG/3ML) 0.083% IN NEBU *I*
2.5000 mg | INHALATION_SOLUTION | RESPIRATORY_TRACT | Status: DC | PRN
Start: 2016-06-16 — End: 2016-06-19
  Administered 2016-06-17 – 2016-06-19 (×7): 2.5 mg via RESPIRATORY_TRACT
  Filled 2016-06-16 (×7): qty 3

## 2016-06-16 MED ORDER — HYDROMORPHONE PCA 1 MG/ML *WRAPPED*
INTRAMUSCULAR | Status: AC
Start: 2016-06-16 — End: 2016-06-16
  Filled 2016-06-16: qty 25

## 2016-06-16 MED ORDER — OXYCODONE HCL 5 MG PO TABS *I*
10.0000 mg | ORAL_TABLET | ORAL | Status: DC | PRN
Start: 2016-06-16 — End: 2016-06-19
  Administered 2016-06-17 – 2016-06-18 (×5): 10 mg via ORAL
  Filled 2016-06-16 (×5): qty 2

## 2016-06-16 MED ORDER — OXYCODONE HCL 5 MG PO TABS *I*
5.0000 mg | ORAL_TABLET | ORAL | Status: DC | PRN
Start: 2016-06-16 — End: 2016-06-19
  Filled 2016-06-16: qty 1

## 2016-06-16 MED ORDER — ERYTHROMYCIN 5 MG/GM OP OINT *I*
TOPICAL_OINTMENT | Freq: Three times a day (TID) | OPHTHALMIC | Status: AC
Start: 2016-06-16 — End: 2016-06-19
  Filled 2016-06-16: qty 1

## 2016-06-16 MED ORDER — ARTIFICIAL TEARS OP OINT *WRAPPED* *I*
TOPICAL_OINTMENT | OPHTHALMIC | Status: DC | PRN
Start: 2016-06-16 — End: 2016-06-19
  Filled 2016-06-16: qty 1

## 2016-06-16 MED ORDER — PROMETHAZINE HCL 25 MG/ML IJ SOLN *I*
6.2500 mg | Freq: Once | INTRAMUSCULAR | Status: AC | PRN
Start: 2016-06-16 — End: 2016-06-16

## 2016-06-16 MED ORDER — PROPOFOL 10 MG/ML IV EMUL (INTERMITTENT DOSING) WRAPPED *I*
INTRAVENOUS | Status: AC
Start: 2016-06-16 — End: 2016-06-16
  Filled 2016-06-16: qty 20

## 2016-06-16 MED ORDER — HYDROMORPHONE HCL 2 MG/ML IJ SOLN *WRAPPED*
0.5000 mg | INTRAMUSCULAR | Status: AC | PRN
Start: 2016-06-16 — End: 2016-06-16
  Administered 2016-06-16 (×2): 0.5 mg via INTRAVENOUS

## 2016-06-16 MED ORDER — LIDOCAINE 5 % EX PTCH *I*
1.0000 | MEDICATED_PATCH | CUTANEOUS | Status: DC
Start: 2016-06-16 — End: 2016-06-19
  Administered 2016-06-16 – 2016-06-18 (×3): 1 via TRANSDERMAL
  Filled 2016-06-16 (×4): qty 1

## 2016-06-16 MED ORDER — DOCUSATE SODIUM 100 MG PO CAPS *I*
100.0000 mg | ORAL_CAPSULE | Freq: Three times a day (TID) | ORAL | Status: DC
Start: 2016-06-16 — End: 2016-06-19
  Administered 2016-06-16 – 2016-06-19 (×9): 100 mg via ORAL
  Filled 2016-06-16 (×9): qty 1

## 2016-06-16 MED ORDER — AMITRIPTYLINE HCL 50 MG PO TABS *I*
50.0000 mg | ORAL_TABLET | Freq: Every evening | ORAL | Status: DC
Start: 2016-06-16 — End: 2016-06-19
  Administered 2016-06-16 – 2016-06-18 (×3): 50 mg via ORAL
  Filled 2016-06-16 (×4): qty 1

## 2016-06-16 MED ORDER — ACETAMINOPHEN 500 MG PO TABS *I*
1000.0000 mg | ORAL_TABLET | Freq: Three times a day (TID) | ORAL | Status: DC
Start: 2016-06-16 — End: 2016-06-19
  Administered 2016-06-16 – 2016-06-19 (×9): 1000 mg via ORAL
  Filled 2016-06-16 (×9): qty 2

## 2016-06-16 MED ORDER — ONDANSETRON HCL 2 MG/ML IV SOLN *I*
4.0000 mg | Freq: Four times a day (QID) | INTRAMUSCULAR | Status: DC | PRN
Start: 2016-06-16 — End: 2016-06-19
  Administered 2016-06-17: 4 mg via INTRAVENOUS
  Filled 2016-06-16: qty 2

## 2016-06-16 MED ORDER — HYDROMORPHONE HCL 2 MG/ML IJ SOLN *WRAPPED*
INTRAMUSCULAR | Status: AC
Start: 2016-06-16 — End: 2016-06-16
  Administered 2016-06-16: 0.5 mg via INTRAVENOUS
  Filled 2016-06-16: qty 1

## 2016-06-16 MED ORDER — LACTATED RINGERS IV SOLN *I*
20.0000 mL/h | INTRAVENOUS | Status: DC
Start: 2016-06-16 — End: 2016-06-16
  Administered 2016-06-16: 20 mL/h via INTRAVENOUS

## 2016-06-16 MED ORDER — DEXAMETHASONE SODIUM PHOSPHATE 4 MG/ML INJ SOLN *WRAPPED*
INTRAMUSCULAR | Status: AC
Start: 2016-06-16 — End: 2016-06-16
  Filled 2016-06-16: qty 2

## 2016-06-16 MED ORDER — CEFAZOLIN 2000 MG IN STERILE WATER 20ML SYRINGE *I*
PREFILLED_SYRINGE | INTRAVENOUS | Status: AC
Start: 2016-06-16 — End: 2016-06-16
  Filled 2016-06-16: qty 20

## 2016-06-16 MED ORDER — SODIUM CHLORIDE 0.9 % IV SOLN WRAPPED *I*
20.0000 mL/h | Status: DC
Start: 2016-06-16 — End: 2016-06-16

## 2016-06-16 MED ORDER — MIDAZOLAM HCL 1 MG/ML IJ SOLN *I* WRAPPED
INTRAMUSCULAR | Status: DC | PRN
Start: 2016-06-16 — End: 2016-06-16
  Administered 2016-06-16: 2 mg via INTRAVENOUS

## 2016-06-16 MED ORDER — LIDOCAINE HCL 1 % IJ SOLN *I*
0.1000 mL | INTRAMUSCULAR | Status: DC | PRN
Start: 2016-06-16 — End: 2016-06-16
  Administered 2016-06-16: 0.1 mL via SUBCUTANEOUS

## 2016-06-16 MED ORDER — MIDAZOLAM HCL 1 MG/ML IJ SOLN *I* WRAPPED
INTRAMUSCULAR | Status: AC
Start: 2016-06-16 — End: 2016-06-16
  Filled 2016-06-16: qty 2

## 2016-06-16 MED ORDER — NALOXONE HCL 0.4 MG/ML IJ SOLN *WRAPPED*
0.1000 mg | Status: DC | PRN
Start: 2016-06-16 — End: 2016-06-19

## 2016-06-16 MED ORDER — SUGAMMADEX SODIUM 100 MG/1ML IV SOLN *WRAPPED*
INTRAVENOUS | Status: AC
Start: 2016-06-16 — End: 2016-06-16
  Filled 2016-06-16: qty 2

## 2016-06-16 MED ORDER — HEPARIN SODIUM 5000 UNIT/ML SQ *I*
5000.0000 [IU] | SUBCUTANEOUS | Status: AC
Start: 2016-06-16 — End: 2016-06-16
  Administered 2016-06-16: 5000 [IU] via SUBCUTANEOUS

## 2016-06-16 MED ORDER — LACTATED RINGERS IV SOLN *I*
100.0000 mL/h | INTRAVENOUS | Status: AC
Start: 2016-06-16 — End: 2016-06-16
  Administered 2016-06-16: 100 mL/h via INTRAVENOUS

## 2016-06-16 MED ORDER — FENTANYL CITRATE 50 MCG/ML IJ SOLN *WRAPPED*
INTRAMUSCULAR | Status: DC | PRN
Start: 2016-06-16 — End: 2016-06-16
  Administered 2016-06-16: 25 ug via INTRAVENOUS
  Administered 2016-06-16: 100 ug via INTRAVENOUS
  Administered 2016-06-16: 12:00:00 25 ug via INTRAVENOUS
  Administered 2016-06-16: 100 ug via INTRAVENOUS
  Administered 2016-06-16 (×2): 25 ug via INTRAVENOUS

## 2016-06-16 MED ORDER — HALOPERIDOL LACTATE 5 MG/ML IJ SOLN *I*
0.5000 mg | Freq: Once | INTRAMUSCULAR | Status: AC | PRN
Start: 2016-06-16 — End: 2016-06-16

## 2016-06-16 MED ORDER — CIPROFLOXACIN IN D5W 400 MG/200ML IV SOLN *I*
INTRAVENOUS | Status: AC
Start: 2016-06-16 — End: 2016-06-16
  Filled 2016-06-16: qty 200

## 2016-06-16 MED ORDER — BUPIVACAINE-EPINEPHRINE 0.5 % IJ SOLUTION *WRAPPED*
INTRAMUSCULAR | Status: DC | PRN
Start: 2016-06-16 — End: 2016-06-16
  Administered 2016-06-16: 30 mL via SUBCUTANEOUS

## 2016-06-16 MED ORDER — ONDANSETRON HCL 2 MG/ML IV SOLN *I*
INTRAMUSCULAR | Status: DC | PRN
Start: 2016-06-16 — End: 2016-06-16
  Administered 2016-06-16: 4 mg via INTRAMUSCULAR

## 2016-06-16 MED ORDER — LIDOCAINE HCL 2 % IJ SOLN *I*
INTRAMUSCULAR | Status: DC | PRN
Start: 2016-06-16 — End: 2016-06-16
  Administered 2016-06-16: 100 mg via INTRAVENOUS

## 2016-06-16 MED ORDER — OMEPRAZOLE 20 MG PO CPDR *I*
20.0000 mg | DELAYED_RELEASE_CAPSULE | Freq: Every morning | ORAL | Status: DC
Start: 2016-06-17 — End: 2016-06-19
  Administered 2016-06-17 – 2016-06-19 (×3): 20 mg via ORAL
  Filled 2016-06-16 (×3): qty 1

## 2016-06-16 MED ORDER — ENOXAPARIN SODIUM 40 MG/0.4ML IJ SOSY *I*
40.0000 mg | PREFILLED_SYRINGE | INTRAMUSCULAR | Status: DC
Start: 2016-06-16 — End: 2016-06-16
  Filled 2016-06-16: qty 0.4

## 2016-06-16 MED ORDER — LIDOCAINE HCL 2 % (PF) IJ SOLN *I*
INTRAMUSCULAR | Status: AC
Start: 2016-06-16 — End: 2016-06-16
  Filled 2016-06-16: qty 5

## 2016-06-16 MED ORDER — PROPOFOL 10 MG/ML IV EMUL (INTERMITTENT DOSING) WRAPPED *I*
INTRAVENOUS | Status: DC | PRN
Start: 2016-06-16 — End: 2016-06-16
  Administered 2016-06-16 (×5): 20 mg via INTRAVENOUS
  Administered 2016-06-16: 120 mg via INTRAVENOUS

## 2016-06-16 MED ORDER — LIDOCAINE HCL 1 % IJ SOLN *I*
INTRAMUSCULAR | Status: AC
Start: 2016-06-16 — End: 2016-06-16
  Filled 2016-06-16: qty 2

## 2016-06-16 MED ORDER — LACTATED RINGERS IV SOLN *I*
100.0000 mL/h | INTRAVENOUS | Status: DC
Start: 2016-06-16 — End: 2016-06-17
  Administered 2016-06-16: 100 mL/h via INTRAVENOUS

## 2016-06-16 MED ORDER — CEFAZOLIN 2000 MG IN 100 ML D5W *I*
2000.0000 mg | INTRAVENOUS | Status: AC
Start: 2016-06-16 — End: 2016-06-16
  Administered 2016-06-16: 2000 mg via INTRAVENOUS

## 2016-06-16 SURGICAL SUPPLY — 37 items
APPLIER CLIP MULTIPLE 20MED LG (Supply) IMPLANT
BAG SPEC RETRV 224ML W4XL6IN DIA10MM ENDOPCH RETRV (Supply) ×1 IMPLANT
BAG ZIPLOCK BIOHAZ W/POUCH 6X9 USE 246168 (Supply) ×3 IMPLANT
CATH ROBINSON URETH VINYL 16FR LF (Supply) ×2 IMPLANT
CATH THORACIC PVC STR 28F (Supply) ×2 IMPLANT
CONTAINER SPEC LEAKPRF 3 OZ (Supply) ×3 IMPLANT
DRAIN CHEST SGL ADULT DRY S (Supply) ×2 IMPLANT
DRESSING TELFA 8X3 RELEASE (Dressing) ×3 IMPLANT
GLOVE SURG BIOGEL PI ULTRATOUCH SZ 7.0 (Glove) ×6 IMPLANT
HEMOSTAT FIBRILLAR ABSORB 2X4 (Supply) ×2 IMPLANT
HEMOSTATIC AGNT 5GM ABSRB ARISTA AH (Supply) ×2 IMPLANT
KIT SEALANT PROGEL PLEURAL AIR LEAK 4ML (Supply) IMPLANT
PACK CUSTOM THORACIC CDS (Pack) ×3 IMPLANT
PACK TOWEL LIGHT BLUE STERILE (Supply) ×3 IMPLANT
POUCH ENDO 10MM (Supply) ×1
POUCH ENDOCATCH II LG 15MM (Other) IMPLANT
POUCH SURGICAL TISSUE 5INX8IN USE PMM 229945 (Other) IMPLANT
POUCH SURGICAL TISSUE 8INX10IN (Other) IMPLANT
PROTECTOR ULNA NERVE ~~LOC~~ (Supply) ×3 IMPLANT
RELOAD ECHELON ENDOPATH WHITE 35 (Supply) IMPLANT
RELOAD EGIA TRS AMT 45 USE 229903 (Supply) ×2 IMPLANT
RELOAD EGIA TRS AMT 60 USE 229904 (Supply) ×6 IMPLANT
RELOAD EGIA TRS AXT 60 USE 229906 (Supply) ×10 IMPLANT
SHEAR HARMONIC ACE +7 5MMX23CM (Supply) IMPLANT
SHEAR HARMONIC ACE+7 (Supply) IMPLANT
SLEEVE COMP KNEE HI MED (Supply) ×3 IMPLANT
SOL H2O IRRIG 500ML STERILE BTL (Solution) ×1 IMPLANT
SOL H2O IRRIG STER 500ML BTL (Solution) ×3
SOL SOD CHL IRRIG 500ML BTL (Solution) ×3 IMPLANT
SPONGE CURITY 2FT 3 X 4IN LF STER (Dressing) ×3 IMPLANT
SPONGE KITTNER ENDOSCOPIC 5MM (Sponge) ×3 IMPLANT
STAPLER ECHELON FLEX VASC POWERED 35 (Supply) IMPLANT
STAPLER ENDO GIA SHT HANDLE 4 (Supply) IMPLANT
STAPLER ENDO GIA STD HANDLE 4 (Supply) ×2 IMPLANT
SUTR PROLENE MONO 5-0 RB-1 BLUE (Suture) IMPLANT
TIP APPLICATOR SPRAY FOR PROGEL 11IN (Supply) IMPLANT
TROCAR THORACOPORT 15MM (Other) IMPLANT

## 2016-06-16 NOTE — Addendum Note (Signed)
Addendum  created 06/16/16 1653 by Elveria Royalsang, Ryeleigh Santore, MD    New alternative orders accepted, Order sets accessed, Sign clinical note

## 2016-06-16 NOTE — Anesthesia Procedure Notes (Signed)
---------------------------------------------------------------------------------------------------------------------------------------    AIRWAY   GENERAL INFORMATION AND STAFF    Patient location during procedure: OR       Date of Procedure: 06/16/2016 9:20 AM  CONDITION PRIOR TO MANIPULATION     Current Airway/Neck Condition:  Normal        For more airway physical exam details, see Anesthesia PreOp Evaluation  AIRWAY METHOD     Patient Position:  Sniffing    Preoxygenated: yes      Mask Difficulty Assessment:  1 - vent by mask       Mask NMB: 1 - vent by mask      Technique Used for Successful ETT Placement:  Direct laryngoscopy and flexible bronchoscopy    Devices/Methods Used in Placement:  Intubating stylet and cricoid pressure    Blade Type:  Macintosh    Laryngoscope Blade/Video laryngoscope Blade Size:  3    Cormack-Lehane Classification:  Grade IIa - partial view of glottis    Placement Verified by: capnometry and auscultation      Number of Attempts at Approach:  1    Number of Other Approaches Attempted:  0  FINAL AIRWAY DETAILS    Final Airway Type:  Endotracheal airway    Final Endotracheal Airway:  Left double lumen      Cuffed: cuffed    ETT Double Lumen (fr):  37Fr  ----------------------------------------------------------------------------------------------------------------------------------------

## 2016-06-16 NOTE — Progress Notes (Signed)
Pt admitted to unit from OR s/p R Vats wedge. Pt has to chest tubes on the right side both to - 20 suction no leaks or crepitus noted. Chest tube one with minimal output. Pt on PCA pump and instructed on use. Pt states pain is a 9/10. Lidocaine patch ordered and pt given tylenol. Pt A&Ox3, states she is independent at baseline and lives alone with helpful children close by. 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 Pt on 2L NC at this time and has MIVF at 100 ml/hour. Pt ambulated OOB to bathroom shortly after admission and tolerated well. Pt c/o feeling like there is sand in her right eye. Erythromycin ointment ordered. Plan for d/c when medically stable. Will continue to monitor.    Markiyah Dutyara E Darothy Courtright, RN  06/16/2016  4:54 PM

## 2016-06-16 NOTE — Anesthesia Postprocedure Evaluation (Signed)
Anesthesia Post-Op Note    Patient: Brianna Barnett    Procedure(s) Performed:  Procedure Summary     Date Anesthesia Start Anesthesia Stop Room / Location    06/16/16 0904 [id number] [id number] [facility]       Procedure Diagnosis Surgeon Attending Anesthesia    THORACOSCOPY (VATS) Wedge (Right Chest) Other pulmonary insufficiency, not elsewhere classified  (Other pulmonary insufficiency, not elsewhere classified [J98.4]) Barrett HenleJones, Carolyn, MD Marga HootsStern, Kiing Deakin H, MD        Recovery Vitals  BP: [id number]/73 (06/16/2016  2:15 PM)  Heart Rate: 92 (06/16/2016  2:15 PM)  Heart Rate (via Pulse Ox): 85 (06/16/2016  2:15 PM)  Resp: 13 (06/16/2016  2:15 PM)  Temp: 36.3 C (97.3 F) (06/16/2016  2:15 PM)  SpO2: 97 % (06/16/2016  2:15 PM)  O2 Device: Nasal cannula (06/16/2016  2:15 PM)  O2 Flow Rate: 2 L/min (06/16/2016  2:15 PM)   0-10 Scale: 5 (06/16/2016  2:30 PM)  Anesthesia type:  General  Complications Noted During Procedure or in PACU:  None   Comment:    Patient Location:  PACU  Level of Consciousness:    Recovered to baseline and awake  Patient Participation:     Able to participate  Temperature Status:    Normothermic  Oxygen Saturation:    Within patient's normal range  Cardiac Status:   Within patient's normal range  Fluid Status:    Stable  Airway Patency:     Yes  Pulmonary Status:    Baseline  Pain Management:    Adequate analgesia  Nausea and Vomiting:  None    Post Op Assessment:    Tolerated procedure well   Attending Attestation:  All indicated post anesthesia care provided     -

## 2016-06-16 NOTE — Anesthesia Case Conclusion (Signed)
CASE CONCLUSION  Emergence  Actions:  Suctioned, soft bite block and extubated  Criteria Used for Airway Removal:  Adequate Tv & RR, acceptable O2 saturation and following commands  Assessment:  Routine  Transport  Directly to: PACU  Airway:  Nasal cannula  Oxygen Delivery:  2 lpm  Position:  Supine  Patient Condition on Handoff  Level of Consciousness:  Mildly sedated  Patient Condition:  Stable  Handoff Report to:  RN

## 2016-06-16 NOTE — Plan of Care (Signed)
Post-Operative Bladder Elimination     Patient is able to empty bladder or return to baseline Maintaining        Post-Operative Complications     Prevent post-operative complications Maintaining     Patient will remain free from symptoms of infection-post op Maintaining        Safety     Patient will remain free of falls Maintaining          Cognitive function     Cognitive function will be maintained or return to baseline Progressing towards goal        Mobility     Functional status is maintained or improved - Geriatric Progressing towards goal        Nutrition     Nutritional status is maintained or improved - Geriatric Progressing towards goal        Pain/Comfort     Patient's pain or discomfort is manageable Progressing towards goal        Post-Operative Bowel Elimination     Elimination pattern is normal or improving Progressing towards goal        Psychosocial     Demonstrates ability to cope with illness Progressing towards goal        Gayl Dutyara E Rain Wilhide, RN  4:36 PM  06/16/2016

## 2016-06-16 NOTE — Progress Notes (Addendum)
Thoracic Surgery Post-op Check:    Interval history:   65 y.o. female with a 30 pack year smoking history that quit 15 years ago, arthritis, GERD and history of numerous episodes of pneumonia and bronchitis and cystic structure in the right lower lobe concerning for bullous.    Pain only mildly controlled, pain in her right eye with movement, describes it as feeling like there is sand in her eye. Mild nausea, denies chest pain or shortness of breath.   Medications:   Scheduled Meds:   enoxaparin  40 mg Subcutaneous Q24H    docusate sodium  100 mg Oral TID    magnesium hydroxide  30 mL Oral Daily    acetaminophen  1,000 mg Oral Q8H    ketorolac  15 mg Intravenous Q6H    amitriptyline  50 mg Oral Nightly    [START ON 06/17/2016] omeprazole  20 mg Oral QAM    lidocaine  1 patch Transdermal Q24H     Continuous Infusions:   lactated ringers 100 mL/hr (06/16/16 1240)    HYDROmorphone       PRN Meds:.naloxone, ondansetron, oxyCODONE, oxyCODONE, promethazine, haloperidol lactate, albuterol    Physical Exam:   BP: (98-149)/(57-86)   Temp:  [35.8 C (96.4 F)-36.7 C (98.1 F)]   Temp src: Temporal (09/29 1455)  Heart Rate:  [87-94]   Resp:  [11-20]   SpO2:  [91 %-100 %]   Height:  [157.5 cm (5\' 2" )]   Weight:  [85.4 kg (188 lb 4.4 oz)]     General: A/O x 3, NAD  Cardiovascular: S1 and S2 Regular, no rub, gallop or mummer  Lungs:CTA Bilat  Abdomen:+BS, soft non tender, non distended  Incision sites: Right VATS inc CDI with dermabond  Extremities:Warm and dry no edema      Chest tube (cc/24 hrs): x 2  #1 0 ml, and #2 with 70ml of sanguinous drainage    Labs:   Chemistry:No results for input(s): NA, K, CL, CO2, UN, CREAT, GLU, CA, MG, PO4 in the last 168 hours.  Heme/coag:No results for input(s): WBC, HCT, PLT, PTI, INR, PTT in the last 168 hours.    Imaging:   *chest Standard Single View    Result Date: 06/16/2016  IMPRESSION:  Interval post-surgical changes of the right lung for previously seen large bulla. No  radiographic evidence of pneumothorax.  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     F/E/N: advance to clear liquid diet, continue MIVF, may HL with adequate PO and UOP.   Analgesia: Tylenol ATC, Lidoderm patch  Oxycodone and IV dilaudid prn breakthrough, out put for chest tube appears sanguinous although currently low volume will hold NSAIDS for now   CV: Remains HDS, continue to monitor character and volume from Chest Tube   Pulm: Aggressive pulmonary toilet, IS, Flutter Valve, Continue CT x 2 -20 cm sxn    GI: Resume Omeprazole   Renal: Due to void    DVT PPX: lovenox  PT/OT/Activity: ambulate this afternoon    Have Anesthesia assess right eye for possible right corneal abrasion        Diagnoses for this hospitalization include:  1. acute pulmonary insufficiency following surgery   2. Right Lung Bullous Disease     Marcello FennelKara L Lowella Kindley, NP  Thoracic surgery

## 2016-06-16 NOTE — Progress Notes (Signed)
Report Given To   Thalia BloodgoodKelsey Donoghue, RN      Descriptive Sentence / Reason for Admission     Pt admitted for R VATS wedge for cystic structure in the RLL    PMH: 30 year pack history, GERD, pneumonia, bronchitis, obesity, anxiety      Active Issues / Relevant Events     R Chest tube 1 minimal serosanguinous output, -20 -crep/-leak  R chest tube 2 -20 -crep/-leak  EES ointment for corneal abrasion to R eye  Lidocaine patch  2L NC-- not at baseline  PCA pump for pain control  MIVF 100 ml/hour        To Do List    Monitor VS  Monitor I&O  Monitor Labs  Pain Management  Encourage Ambulation and Pulmonary Toileting        Anticipatory Guidance / Discharge Planning    When medically stable     Zinnia Dutyara E Elwyn Lowden, RN  06/16/2016

## 2016-06-16 NOTE — Progress Notes (Addendum)
Department of Anesthesiology and Perioperative Medicine  Corneal Abrasion Progress Note    Brianna Barnett is a 65 y.o. female who underwent Right VATS wedge staple blebectomy on 06/16/16 with Dr. Yetta BarreJones.    Was called to the bedside by unit NP to assess for possible corneal abrasion.  Pt complaining of: Foreign body sensation in the Right eye. Feels like there is sand in her eye.    After tetracaine drops (2) applied: immediate relief of foreign body sensation  Foreign bodies apparent with Q tip exam: none  On exam with Joseph ArtWoods lamp/fluoroscein: horizontal linear abrasion across length of cornea, lower half    Discussed corneal abrasion with patient. Explained that this would likely improve on its own over the next 24-48hrs.   Antibiotic ointment ordered TID x 3 days with eye shield/patch available per pt preference.   Sterile saline eyedrops PRN for comfort.    Anesthesia OR team notified.     Author:  Elveria RoyalsMingyun Mariyanna Mucha, MD as of: 06/16/2016  at: 4:40 PM  Pager: [phone]  PIC: [phone]

## 2016-06-16 NOTE — Progress Notes (Signed)
Chest xray done postop in PACU.  Read by Dr. Gwenith DailyHaas, ok.  Miquel DunnErin Chelsie Burel, RN

## 2016-06-16 NOTE — Progress Notes (Signed)
Utilization Management    Level of Care Inpatient as of the date 06/16/2016      Shawneequa Baldridge L Raad Clayson, RN     Pager: [phone]

## 2016-06-16 NOTE — OR Nursing (Signed)
Pt was secured left lateral with all pressure points padded. Pause was completed as per policy with no issues identified. DR RN

## 2016-06-16 NOTE — INTERIM OP NOTE (Signed)
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]       Procedure(s) Performed: Procedure:    THORACOSCOPY (VATS) Wedge  CPT(R) Code:  0454032666 - PR THORACOSCOPY W/THERA WEDGE RESEXN INITIAL UNILAT         Additional CPT Codes: 9811932655: Pr Thoracoscopy W/Resection Bullae W/Wo Pleural Px;         Anesthesia Type: General        Fluid Totals: I/O this shift:  09/29 0700 - 09/29 1459  In: 1700 (19.9 mL/kg) [I.V.:1700]  Out: 75 (0.9 mL/kg) [Blood:75]  Net: 1625  Weight: 85.4 kg        Estimated Blood Loss: Blood Loss: 75 mL       Specimens to Pathology:    ID Type Source Tests Collected by Time Destination   A : Right Lower Lung Lobe Wedge TISSUE Lung SURGICAL PATHOLOGY Vivia BirminghamRosdahl, Desiree, RN 06/16/2016 1030    B : Right Lower Lung Lobe Wedge #2 with a small piece of right upper lung lobe TISSUE Lung SURGICAL PATHOLOGY Vivia BirminghamRosdahl, Desiree, RN 06/16/2016 1101           Temporary Implants:        Packing:                 Patient Condition: good       Findings (Including unexpected complications): Large deep thick walled bullae.  Aspiration of bullae and wedge resection     Signed:  Barrett HenleAROLYN [place], MD  on 06/16/2016 at 11:39 AM     [address] wedge resection of giant bullae    STS DATABASE  Name: Rich Braveheresa Corliss  MRN: [id number]      PFTs:   PFTs DONE:   FEV1: 78 %  DLCO: 76 %      Diagnosis: Other: see brief op note    Zubrod: symptoms but ambulatory    Status of operation: elective  Reoperation: no  ASA:ASA 2 - Patient with mild systemic disease with no functional limitations  Blood Transfusion: no    Pt Dispo: floor

## 2016-06-16 NOTE — H&P (View-Only) (Signed)
Anesthesia Pre-operative History and Physical for Brianna Barnett    ______________________________________________________________________________________  History and Physical Completed at [facility]  Summary:  Brianna Barnett presents preoperatively for anesthesia evaluation prior to THORACOSCOPY (VATS) Wedge (Right ) on 06/16/16 with Dr. Ronnald Ramp.   She has a medical history significant for:  other pulmonary insufficiency - followed by Dr. Hartford Poli (note in e-records from 04/04/16, letter 05/02/16).   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 Mitral sclerosis.    Nuclear stress 02/13/16:   1. Myocardial perfusion imaging reveals normal rest and stress images. No evidence of ischemia.  2. No chest pain or shortness of breath noted with pharmacologic stress.   3. Normal EKG response to pharmocologic stress. No evidence of ischemia. No arrhythmias.   4. Normal LV cavity size and systolic function. The calculated LVEF 74%.  5. Normal TID index.   Electrophysiology/AICD/Pacer:  EKG 06/07/16: sinus rhythm. Left axis deviation. Rate 93, PR 172.  Pulmonary Function Tests:  PFT's 05/29/16 : FEV1 1.79 (78 % Pred); FVC 2.46 (81 % Pred), FEV1/FVC 73 % (95 % Pred), DLCO 16.08 (76 % Pred). Normal pulmonary function tests. There has been no significant change from PFTs performed on 04/04/16.    By Pleas Koch, NP at 11:08 AM on 06/06/2016    <[email or web address]>  Anesthesia Evaluation Information Source: patient, records     ANESTHESIA  Pertinent(-):  history of anesthetic complications, Family Hx of Anesthetic  Complications    GENERAL    + Obesity (BMI 32.8)            central    + Contact Precautions (GM died of TB - pt has had no treatment - CXR negative)           TB  Pertinent (-):  substance abuse, history of anesthetic complications, Family Hx of Anesthetic Complications    HEENT    + Corrective Eyewear (reading, driving)            glasses  Pertinent (-):   hearing loss, TMJD, sinus issues, nosebleeds, neck pain  Comment: Detached retina OD in the past with repair   PULMONARY    + Smoker            former, remote history    + Pulmonary Testing    + Restrictive Lung Disease (bronchitis x 4 within year)    + Shortness of breath (+ SOB with 1 FOS - walking 1 mile per day)    + Pneumonia  Pertinent(-): recent URI, cough/congestion, environmental allergies, snoring    CARDIOVASCULAR  Good(4+METs) Exercise Tolerance    + Hypertension             well controlled           + Cardiac Testing (negative per pt)            nuclear stress test    + Dysrhythmias            palpitations, symptomatic  Pertinent(-):  past MI, angina, CAD, anticoagulants, CHF,  orthopnea (2 pillows at night and bed on an incline d/t GERD), DVT    GI/HEPATIC/RENAL  Last PO Intake: Enter Last PO Intake in ROS/Med Hx Tab    + GERD (on omeprazole)            well controlled    + Alcohol use            social  Pertinent(-):  hiatal hernia, PUD (GIB in 30s), nausea, vomiting, liver  issues, pancreatic issues, bowel issues, urinary issues NEURO/PSYCH    + Chronic pain (left ankle and knee)    + Psychiatric Issues (on elavil at hs)          anxiety  Pertinent(-):  headaches (when younger), dizziness/motion sickness, syncope, seizures, cerebrovascular event, gait/mobility issues    ENDO/OTHER  Pertinent(-):  diabetes mellitus, thyroid disease, chemo Hx, menstruating (post-menopause)    HEMALOGIC     Denies hematologic issues    + Arthritis (left knee)            knees and ankle/feet  Pertinent(-):  bruises/bleeds easily, coagulopathy, anticoagulants, blood  transfusion, blood dyscrasia, autoimmune disease       Physical Exam    Airway            Mouth opening: normal            Mallampati: II            TM distance (fb): >3 FB            Neck ROM: full  Dental        Cardiovascular  Normal Exam           Rhythm: regular           Rate: normal    Neurologic    Normal Exam    General Survey    Normal Exam   Pulmonary   Normal Exam    breath sounds clear to auscultation    No rhonchi, wheezes, rales    Mental Status   Normal Exam    oriented to person, place and time     Patient Education from [facility] Provider:  The following items were discussed with Brianna Barnett to his satisfaction and comprehension:  The facility's NPO guidelines were discussed  To call the surgeon if he becomes ill prior to surgery  All questions were answered  Transportation home: daughter  Medications DOS with sip of water: as per AVS  Hold medications AM day of surgery: as per AVS  Nurse reviewed additional items as indicated in the education record.  Kale Spatafore verbalized knowledge and teaching objectives met.  No barriers to learning identified.  Teaching sheet reviewed with patient/family.  IV insertion was reviewed with her.  The importance of coughing and deep breathing was emphasized.  She was instructed on the pain scale and pain management.  Nadyne Clayson was instructed to continue ASA/NSAIDS before surgery.      ________________________________________________________________________  Plan    Possible ASA Score 2  Possible Anesthetic Plan (general)  Induction (RSI); General Anesthesia/Sedation Maintenance Plan (inhaled agents, propofol infusion and neuromuscular blockade);  Airway Manipulation (direct laryngoscopy); Airway (double lumen tube); Line ( use current access); Monitoring (standard ASA); Positioning (lateral decubitus); PONV Plan (dexamethasone and ondansetron); Pain (per surgical team); PostOp (PACU)    Informed Consent     Risks:          Risks discussed were  commensurate with the plan listed above with the following specific points: N/V, aspiration  and sore throat , damage to:(eyes, nerves, teeth), awareness, unexpected serious injury, allergic Rx, death    Anesthetic Consent:      Anesthetic plan (and risks as noted above) were discussed with patient

## 2016-06-16 NOTE — Interval H&P Note (Signed)
UPDATES TO PATIENT'S CONDITION on the DAY OF SURGERY/PROCEDURE    I. Updates to Patient's Condition (to be completed by a provider privileged to complete a H&P, following reassessment of the patient by the provider):    Day of Surgery/Procedure Update:  History  History reviewed and no change    Physical  Physical exam updated and no change            II. Procedure Readiness   I have reviewed the patient's H&P and updated condition. By completing and signing this form, I attest that this patient is ready for surgery/procedure.    III. Attestation   I have reviewed the updated information regarding the patient's condition and it is appropriate to proceed with the planned surgery/procedure.    Barrett HenleAROLYN [place], MD as of 7:09 AM 06/16/2016

## 2016-06-17 LAB — CBC
Hematocrit: 35 % (ref 34–45)
Hemoglobin: 11.1 g/dL — ABNORMAL LOW (ref 11.2–15.7)
MCH: 28 pg/cell (ref 26–32)
MCHC: 32 g/dL (ref 32–36)
MCV: 86 fL (ref 79–95)
Platelets: 277 10*3/uL (ref 160–370)
RBC: 4 MIL/uL (ref 3.9–5.2)
RDW: 15.1 % — ABNORMAL HIGH (ref 11.7–14.4)
WBC: 10.1 10*3/uL — ABNORMAL HIGH (ref 4.0–10.0)

## 2016-06-17 LAB — BASIC METABOLIC PANEL
Anion Gap: 11 (ref 7–16)
CO2: 28 mmol/L (ref 20–28)
Calcium: 8.7 mg/dL (ref 8.6–10.2)
Chloride: 98 mmol/L (ref 96–108)
Creatinine: 0.74 mg/dL (ref 0.51–0.95)
GFR,Black: 97 *
GFR,Caucasian: 84 *
Glucose: 142 mg/dL — ABNORMAL HIGH (ref 60–99)
Lab: 16 mg/dL (ref 6–20)
Potassium: 4.8 mmol/L (ref 3.3–5.1)
Sodium: 137 mmol/L (ref 133–145)

## 2016-06-17 LAB — MAGNESIUM: Magnesium: 1.9 mEq/L (ref 1.3–2.1)

## 2016-06-17 MED ORDER — KETOROLAC TROMETHAMINE 30 MG/ML IJ SOLN *I*
7.5000 mg | Freq: Four times a day (QID) | INTRAMUSCULAR | Status: DC
Start: 2016-06-17 — End: 2016-06-19
  Administered 2016-06-17 – 2016-06-19 (×6): 7.5 mg via INTRAVENOUS
  Filled 2016-06-17 (×6): qty 1

## 2016-06-17 MED ORDER — GUAIFENESIN 600 MG PO TB12 *I*
600.0000 mg | ORAL_TABLET | Freq: Two times a day (BID) | ORAL | Status: DC
Start: 2016-06-17 — End: 2016-06-19
  Administered 2016-06-17 – 2016-06-19 (×4): 600 mg via ORAL
  Filled 2016-06-17 (×5): qty 1

## 2016-06-17 MED ORDER — ENOXAPARIN SODIUM 40 MG/0.4ML IJ SOSY *I*
40.0000 mg | PREFILLED_SYRINGE | INTRAMUSCULAR | Status: DC
Start: 2016-06-17 — End: 2016-06-19
  Administered 2016-06-17 – 2016-06-18 (×2): 40 mg via SUBCUTANEOUS
  Filled 2016-06-17 (×3): qty 0.4

## 2016-06-17 MED ORDER — HYDROMORPHONE HCL 2 MG/ML IJ SOLN *WRAPPED*
0.5000 mg | INTRAMUSCULAR | Status: DC | PRN
Start: 2016-06-17 — End: 2016-06-19

## 2016-06-17 NOTE — Progress Notes (Signed)
Report Given To  Evelene Croonara Fay, RN      Descriptive Sentence / Reason for Admission     Pt admitted for R VATS wedge for cystic structure in the RLL    PMH: 30 year pack history, GERD, pneumonia, bronchitis, obesity, anxiety      Active Issues / Relevant Events     R Chest tube 1 minimal serosanguinous output, -20 -crep/-leak  R chest tube 2 -20 -crep/-leak  EES ointment for corneal abrasion to R eye  Lidocaine patch  2L NC-- not at baseline  PCA pump for pain control, prn oxycodone  MIVF 100 ml/hour        To Do List    Monitor VS  Monitor I&O  Monitor Labs  Pain Management  Encourage Ambulation and Pulmonary Toileting        Anticipatory Guidance / Discharge Planning    When medically stable     Thalia BloodgoodKelsey Donoghue, RN

## 2016-06-17 NOTE — Addendum Note (Signed)
Addendum  created 06/17/16 1406 by Adriana MccallumYang, Phuong Moffatt, MD    Sign clinical note

## 2016-06-17 NOTE — Progress Notes (Signed)
Thoracic Surgery     Interval history:       Pain controlled, pain in her right eye has decreased continues with Mild nausea, denies chest pain or shortness of breath.   Medications:   Scheduled Meds:   docusate sodium  100 mg Oral TID    magnesium hydroxide  30 mL Oral Daily    acetaminophen  1,000 mg Oral Q8H    amitriptyline  50 mg Oral Nightly    omeprazole  20 mg Oral QAM    lidocaine  1 patch Transdermal Q24H    erythromycin   Right Eye TID     Continuous Infusions:   HYDROmorphone      lactated ringers 100 mL/hr (06/16/16 1801)     PRN Meds:.naloxone, ondansetron, oxyCODONE, oxyCODONE, albuterol, ARTIFICIAL TEARS    Physical Exam:   BP: (98-126)/(57-77)   Temp:  [35.8 C (96.4 F)-36.6 C (97.9 F)]   Temp src: Temporal (09/29 2212)  Heart Rate:  [87-92]   Resp:  [11-20]   SpO2:  [90 %-97 %]     General: A/O x 3, NAD  Cardiovascular: S1 and S2 Regular, no rub, gallop or mummer  Lungs:CTA Bilat  Abdomen:+BS, soft non tender, non distended  Incision sites: Right VATS inc CDI with dermabond  Extremities:Warm and dry no edema      Chest tube (cc/24 hrs): x 2  #1 77 ml, and #2 with 229 ml of sanguinous drainage -20 cm sxn negative leak     Labs:   Chemistry:    Recent Labs  Lab 06/17/16  0606   Sodium 137   Potassium 4.8   Chloride 98   CO2 28   UN 16   Creatinine 0.74   Glucose 142*   Calcium 8.7   Magnesium 1.9     Heme/coag:    Recent Labs  Lab 06/17/16  0606   WBC 10.1*   Hematocrit 35   Platelets 277       Imaging:   *chest Standard Single View    Result Date: 06/16/2016  IMPRESSION:  Interval post-surgical changes of the right lung for previously seen large bulla. No radiographic evidence of pneumothorax.  END REPORT      Assessment and Plan:   1 Day Post-Op  Brianna Barnett is a 65 y.o.female former smoker, GERD and arthritis now status post Right VATS wedge staple blebectomy for giant bullae x 2.     F/E/N: advance to regular diet HLIVF   Analgesia: Tylenol ATC, Lidoderm patch  Oxycodone and IV  dilaudid prn breakthrough, ok for NSAIDS   CV: Remains HDS, continue to monitor character and volume from Chest Tube   Pulm: Aggressive pulmonary toilet, IS, Flutter Valve,  CT x 2 -20 cm sxn will plan to seal and likely d/c CT # 1    GI: Bowel meds, Omeprazole   Renal: Due to void    DVT PPX: lovenox  PT/OT/Activity: ambulate this afternoon  Continue antibiotic ointment to right eye for corneal abrasion         Diagnoses for this hospitalization include:  1. acute pulmonary insufficiency following surgery   2. Right Lung Bullous Disease     Marcello FennelKara L Nerea Bordenave, NP  Thoracic surgery

## 2016-06-17 NOTE — Plan of Care (Signed)
Cognitive function     Cognitive function will be maintained or return to baseline Maintaining        Post-Operative Bladder Elimination     Patient is able to empty bladder or return to baseline Maintaining        Post-Operative Bowel Elimination     Elimination pattern is normal or improving Maintaining        Post-Operative Complications     Prevent post-operative complications Maintaining     Patient will remain free from symptoms of infection-post op Maintaining        Psychosocial     Demonstrates ability to cope with illness Maintaining        Safety     Patient will remain free of falls Maintaining          Mobility     Functional status is maintained or improved - Geriatric Progressing towards goal        Nutrition     Nutritional status is maintained or improved - Geriatric Progressing towards goal    Pt advanced to regular diet this AM.     Pain/Comfort     Patient's pain or discomfort is manageable Progressing towards goal    PCA pump d/c'd this AM.     Durene Dutyara E Devaun Hernandez, RN  10:02 AM  06/17/2016

## 2016-06-17 NOTE — Progress Notes (Signed)
Thoracic NP:  Patient chest tube removed per plan. Tube removed with valsalva maneuver, occlusive dressing applied, and post pull chest xray ordered. Pt tolerated well, no evidence of pneumothorax with tube removal and pt instructed to call with sx of shortness of breath.  Audie BoxKara L Alexsandro Salek, NP 06/17/2016 11:55 AM

## 2016-06-17 NOTE — Op Note (Signed)
[name]:   Brianna Barnett, Brianna Barnett  MR #:  [id number]   ACCOUNT #:  [id number] DOB:  1951-08-26    AGE:  65     SURGEON:  Saunders Revelarolyn E Caberfae, MD  CO-SURGEON:    ASSISTANT:    SURGERY DATE:  06/16/2016    FIRST ASSISTANT:  Dr. 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    OPERATIVE PROCEDURE:    1. Right VATS wedge resection of bulla.   2. Bronchoscopy.    ANESTHESIA:  General/Dr. Venetia MaxonStern    FLUIDS:  1.7 L.    ESTIMATED BLOOD LOSS:  75 cc.    CONDITION:  Satisfactory.    PREOPERATIVE HISTORY:  The patient is a 65 year old female, who was found to have a large bulla to the right lower lobe causing compressive atelectasis.  She is otherwise asymptomatic and she presents for plication and resection of the bulla.  After the risks and benefits were explained, the patient agreed to the procedure.    DESCRIPTION OF PROCEDURE:  The patient was taken to the operating room and placed in the supine position on the operating room table.  After adequate IV sedation, the patient was orally intubated with a double-lumen tube.  Diagnostic bronchoscopy was performed, which showed no evidence of endobronchial lesion.  The double-lumen tube was positioned in the correct spot.  The patient was then positioned in a left lateral decubitus position with the right chest up, and the right chest was then prepped and draped in a standard surgical fashion.       A 10 mm incision was made in the lower anterior thorax.  This incision was carried down through the intercostal muscles and the chest was entered.  A trocar and camera were inserted.  We noted there to be a large bulge deep within the right lower lobe, extending along the lateral posterior wall, with a decreased amount of free space.  A second posterior incision was created.  We then created a 3rd incision low down at the level of the diaphragm for the 3D camera.  We began by first  exploring the lung and we took down the inferior pulmonary ligament and freed up the bulla off the posterior mediastinum.  We could then appreciate where the bulla ended at the base of the lung, and using the SeamGuard staplers, we used both 60 purple and black loads.  We performed our first resection of the lower portion of the lower lobe and  removed this with an Endobag and a small part of the bulla was resected.  I then elected to stick a spinal needle in the other large thick-walled portion of the bulla to collapse it further to help with resection.  This did give us further space and allowed us to perform a 2nd resection of the lower lobe area.  We made an attempt to encompass as much of the bulla and the lower lobe that we could.  I did not feel that she warranted a lobectomy at this time for this.  We also extended our staple line incision up into the posterior aspect of the upper lobe.  The 2nd wedge again was performed with the SeamGuard staplers using both 60 black and purple.  The specimen was removed via an Endobag.  On examination, we felt that we had plicated the majority of this thick-walled bleb. No masses or tumors  were appreciated.  There was no evidence of purulence.  Once completed, we then irrigated the thorax until clear.  We found no evidence of bleeding.  Arista anticoagulant was then placed along the posterior mediastinum as well as fibular.  An  intercostal block was performed using 30 cc of 0.5% Marcaine with epinephrine.  Given the plication and the difficulty of resection, two 28 straight chest tubes were placed, 1 posterior and anterior, and secured in place with silk sutures.  The lung was then re-insufflated and the remaining port was closed in a multilayer fashion using 2-0 Vicryl, Monocryl, and Dermabond.  Sponge, needle, and instrument counts were correct at the end of the case.  The plan will be to extubate the patient and transfer her to the recovery room.              ______________________________  Saunders Revel, MD    CEJ/MODL  DD:  06/16/2016 11:49:26  DT:  06/16/2016 22:03:52  Job #:  [id number]    cc:

## 2016-06-17 NOTE — Addendum Note (Signed)
Addendum  created 06/17/16 1013 by Adriana MccallumYang, Amier Hoyt, MD    Sign clinical note

## 2016-06-17 NOTE — Anesthesia Postprocedure Evaluation (Addendum)
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]) Barrett HenleJones, Carolyn, MD Marga HootsStern, David H, MD        Recovery Vitals  BP: 111/67 (06/17/2016  9:26 AM)  Heart Rate: 101 (06/17/2016  9:26 AM)  Heart Rate (via Pulse Ox): 85 (06/16/2016  2:15 PM)  Resp: 16 (06/17/2016  9:26 AM)  Temp: 36.8 C (98.2 F) (06/17/2016  9:26 AM)  SpO2: 93 % (06/17/2016  9:26 AM)  O2 Device: Nasal cannula (06/17/2016  9:26 AM)  O2 Flow Rate: 2 L/min (06/17/2016  9:26 AM)   0-10 Scale: 10 (10) (06/17/2016  9:26 AM)  Anesthesia type:  General  Complications Noted During Procedure or in PACU:  None   Comment:    Patient Location:  [facility]  Level of Consciousness:    Recovered to baseline  Temperature Status:    Normothermic  Oxygen Saturation:    Appropriate for condition  Cardiac Status:   Within patient's normal range  Fluid Status:    Stable  Airway Patency:     Yes  Pulmonary Status:    Stable  Pain Management:    Adequate analgesia  Nausea and Vomiting:  None    Post Op Assessment:    Tolerated procedure well       Complications Noted During Recovery Period:      Corneal Abrasion  Recovery from Anesthesia:      Recovered to baseline level of consciousness  Condition of patient:      Recovered to pre-anesthetic condition

## 2016-06-17 NOTE — Plan of Care (Signed)
Cognitive function     Cognitive function will be maintained or return to baseline Completed or Resolved        Mobility     Functional status is maintained or improved - Geriatric Completed or Resolved        Nutrition     Nutritional status is maintained or improved - Geriatric Completed or Resolved        Psychosocial     Demonstrates ability to cope with illness Completed or Resolved          Pain/Comfort     Patient's pain or discomfort is manageable Maintaining        Post-Operative Bladder Elimination     Patient is able to empty bladder or return to baseline Maintaining        Post-Operative Complications     Prevent post-operative complications Maintaining     Patient will remain free from symptoms of infection-post op Maintaining        Safety     Patient will remain free of falls Maintaining          Post-Operative Bowel Elimination     Elimination pattern is normal or improving Progressing towards goal

## 2016-06-17 NOTE — Progress Notes (Signed)
Report Given To  Thalia BloodgoodKelsey Donoghue, RN      Descriptive Sentence / Reason for Admission     Pt admitted for R VATS wedge for cystic structure in the RLL    PMH: 30 year pack history, GERD, pneumonia, bronchitis, obesity, anxiety      Active Issues / Relevant Events     R Chest tube 1 minimal serosanguinous output, water seal -crep/-leak  R chest tube 2 pulled this AM  EES ointment for corneal abrasion to R eye  Lidocaine patch  Weaned to RA this AM,  PCA pump d/c'd  Oxycodone for pain control, IV dilaudid, IV Toradol          To Do List  Monitor VS  Monitor I&O  Monitor Labs  Pain Management  Dressing change  IS- 500, aim for 2000  Encourage Ambulation and Pulmonary Toileting        Anticipatory Guidance / Discharge Planning    When medically stable     Katoria Dutyara E Noell Shular, RN  5:43 PM  06/17/2016

## 2016-06-18 MED ORDER — MAGNESIUM HYDROXIDE 400 MG/5ML PO SUSP *I*
30.0000 mL | Freq: Every day | ORAL | 0 refills | Status: DC
Start: 2016-06-18 — End: 2017-05-04

## 2016-06-18 MED ORDER — ACETAMINOPHEN 500 MG PO TABS *I*
1000.0000 mg | ORAL_TABLET | Freq: Three times a day (TID) | ORAL | 0 refills | Status: DC
Start: 2016-06-18 — End: 2017-05-04

## 2016-06-18 MED ORDER — OXYCODONE HCL 5 MG PO TABS *I*
5.0000 mg | ORAL_TABLET | ORAL | 0 refills | Status: DC | PRN
Start: 2016-06-18 — End: 2017-05-04

## 2016-06-18 MED ORDER — ARTIFICIAL TEARS OP OINT *WRAPPED* *I*
TOPICAL_OINTMENT | OPHTHALMIC | Status: DC
Start: 2016-06-18 — End: 2017-05-04

## 2016-06-18 MED ORDER — GUAIFENESIN 600 MG PO TB12 *I*
600.0000 mg | ORAL_TABLET | Freq: Two times a day (BID) | ORAL | 0 refills | Status: AC
Start: 2016-06-18 — End: 2016-06-23

## 2016-06-18 NOTE — Progress Notes (Signed)
0404: Paged covering provider, d/t pt desatting in 4770s and 6080s. pt ws on ra. found to be satting in 70s and 80s RA. pt on 5 L via NC satting at 93%. experienced some SOB and pain. albuterol given. fyi. you want the xray early if she is ordered for one

## 2016-06-18 NOTE — Plan of Care (Signed)
Problem: Safety  Goal: Patient will remain free of falls  Outcome: Maintaining     Problem: Pain/Comfort  Goal: Patient's pain or discomfort is manageable  Outcome: Maintaining     Problem: Post-Operative Complications  Goal: Prevent post-operative complications  Outcome: Maintaining  Goal: Patient will remain free from symptoms of infection-post op  Outcome: Maintaining     Problem: Post-Operative Bowel Elimination  Goal: Elimination pattern is normal or improving  Outcome: Maintaining     Problem: Post-Operative Bladder Elimination  Goal: Patient is able to empty bladder or return to baseline  Outcome: Maintaining

## 2016-06-18 NOTE — Progress Notes (Addendum)
Thoracic Surgery     Interval history:   Desaturations to 70s overnight on RA with subjective SOB, placed on 5 L O2 via NC with improvement  CXR obtained  This morning is on 4 L O2 without any SOB or breathing difficulties  Pain issues last evening, but better controlled this morning  Medications:   Scheduled Meds:   enoxaparin  40 mg Subcutaneous Q24H    ketorolac  7.5 mg Intravenous Q6H    guaiFENesin  600 mg Oral 2 times per day    docusate sodium  100 mg Oral TID    magnesium hydroxide  30 mL Oral Daily    acetaminophen  1,000 mg Oral Q8H    amitriptyline  50 mg Oral Nightly    omeprazole  20 mg Oral QAM    lidocaine  1 patch Transdermal Q24H    erythromycin   Right Eye TID     Continuous Infusions:     PRN Meds:.HYDROmorphone PF, naloxone, ondansetron, oxyCODONE, oxyCODONE, albuterol, ARTIFICIAL TEARS    Physical Exam:   BP: (111-119)/(55-72)   Temp:  [36 C (96.8 F)-37 C (98.6 F)]   Temp src: Temporal (10/01 0351)  Heart Rate:  [86-102]   Resp:  [16]   SpO2:  [72 %-95 %]     General: A/O x 3, NAD  Cardiovascular: S1 and S2 Regular, no rub, gallop or mummer  Lungs: Bibasilar crackles  Abdomen:+BS, soft non tender, non distended  Incision sites: Right VATS inc CDI with dermabond  Extremities:Warm and dry no edema    Chest tube (cc/24 hrs): x 1  #1 149 ml of sanguinous drainage to water seal    Labs:   Chemistry:    Recent Labs  Lab 06/17/16  0606   Sodium 137   Potassium 4.8   Chloride 98   CO2 28   UN 16   Creatinine 0.74   Glucose 142*   Calcium 8.7   Magnesium 1.9     Heme/coag:    Recent Labs  Lab 06/17/16  0606   WBC 10.1*   Hematocrit 35   Platelets 277       Imaging:   Chest Single Frontal View    Result Date: 06/18/2016  IMPRESSION:  Postsurgical changes in the right hemithorax.  Right-sided chest tube with tip near the apex.  Small persistent right apical pneumothorax.  END REPORT  I have personally reviewed the image(s) and the resident's interpretation and agree with or edited the  findings.      *chest Standard Single View    Result Date: 06/17/2016  IMPRESSION:  Small right apical pneumothorax. Interval removal of one of the chest tubes. One right apical chest tube remains in place.  END REPORT   I have personally reviewed the image(s) and the resident's interpretation and agree with or edited the findings.    *chest Standard Single View    Result Date: 06/16/2016  IMPRESSION:  Interval post-surgical changes of the right lung for previously seen large bulla. No radiographic evidence of pneumothorax.  END REPORT      Assessment and Plan:   2 Days Post-Op  Brianna Barnett is a 65 y.o.female former smoker, GERD and arthritis now status post Right VATS wedge staple blebectomy for giant bullae x 2.     F/E/N: advance to regular diet HLIVF   Analgesia: Tylenol ATC, Lidoderm patch  Oxycodone and IV dilaudid prn breakthrough, ok for NSAIDS   CV: Remains HDS, CT to seal  Pulm:  Aggressive pulmonary toilet, IS, Flutter Valve,  CT to water seal with SS output, clamp trial with possible d/c later today/tomorrow  GI: Bowel meds, Omeprazole   Renal: Voiding appropriately  DVT PPX: lovenox  PT/OT/Activity: OOB and ambulate  Continue antibiotic ointment to right eye for corneal abrasion         Diagnoses for this hospitalization include:  1. acute pulmonary insufficiency following surgery   2. Right Lung Bullous Disease     Emilia Beckrevor Shaeley Segall, MD  Thoracic surgery

## 2016-06-18 NOTE — Progress Notes (Signed)
Thoracic NP:  Patient chest tube removed per plan. Tube removed with valsalva maneuver, occlusive dressing applied, and post pull chest xray ordered. Pt tolerated well, no evidence of pneumothorax with tube removal and pt instructed to call with sx of shortness of breath.  Audie BoxKara L Chastin Riesgo, NP 06/18/2016 4:20 PM

## 2016-06-18 NOTE — Progress Notes (Signed)
Report Given To  Thalia BloodgoodKelsey Donoghue RN      Descriptive Sentence / Reason for Admission     Pt admitted for R VATS wedge for cystic structure in the RLL    PMH: 30 year pack history, GERD, pneumonia, bronchitis, obesity, anxiety      Active Issues / Relevant Events   Desated with extreme pain- placed on 5 L NC overnight 10/1- weaned to 2L during the day  R Chest tube clamped in AM, xray done, CT d/c  EES ointment for corneal abrasion to R eye  Lidocaine patch  Oxycodone for pain control, IV Toradol          To Do List  Monitor VS  Monitor I&O  Monitor Labs  Pain Management  Monitor O2 (weaned to room air from 2L, O2 sat 91%)  Dressing change  IS- 750, aim for 2000  Encourage Ambulation and Pulmonary Toileting        Anticipatory Guidance / Discharge Planning    Plan for d/c 10/2

## 2016-06-19 ENCOUNTER — Encounter: Payer: Self-pay | Admitting: Thoracic/Foregut Surgery

## 2016-06-19 MED ORDER — DOCUSATE SODIUM 100 MG PO CAPS *I*
100.0000 mg | ORAL_CAPSULE | Freq: Two times a day (BID) | ORAL | 0 refills | Status: AC
Start: 2016-06-19 — End: 2016-06-29

## 2016-06-19 MED ORDER — IBUPROFEN 200 MG PO TABS *I*
200.0000 mg | ORAL_TABLET | Freq: Three times a day (TID) | ORAL | Status: DC | PRN
Start: 2016-06-19 — End: 2017-06-29

## 2016-06-19 MED ORDER — IBUPROFEN 200 MG PO TABS *I*
600.0000 mg | ORAL_TABLET | Freq: Three times a day (TID) | ORAL | Status: DC
Start: 2016-06-19 — End: 2016-06-19

## 2016-06-19 MED ORDER — IBUPROFEN 600 MG PO TABS *I*
600.0000 mg | ORAL_TABLET | Freq: Three times a day (TID) | ORAL | 0 refills | Status: AC | PRN
Start: 2016-06-19 — End: 2016-06-29

## 2016-06-19 NOTE — Discharge Summary (Signed)
Name: Brianna Barnett MRN: [id number] DOB: 08/30/1951     Admit Date: 06/16/2016   Date of Discharge: 06/19/2016    Patient was accepted for discharge to   Home or Self Care [1]           Discharge Attending Physician: Barrett HenleJONES, CAROLYN      Hospitalization Summary    CONCISE NARRATIVE: 65 year old female, who was found to have a large bulla to the right lower lobe causing compressive atelectasis.  She is otherwise asymptomatic and she presents for plication and resection of the bulla. Normal postop course, chest tubes removed 9/30 and 10/1. She had some respiratory insufficiency which resolved with additional pulmonary toilet. She was stable on room air the day of discharge 10/2. Follow up outpatient in about 2 weeks.       OR PROCEDURE:   06/16/2016  OPERATIVE PROCEDURE:    1. Right VATS wedge resection of bulla.   2. Bronchoscopy.    SIGNIFICANT MED CHANGES: Yes  Oxycodone, motrin, colace/MOM prn, mucinex      Signed: Jerral RalphWendy C Denora Wysocki, PA  On: 06/19/2016  at: 9:34 AM

## 2016-06-19 NOTE — Discharge Instructions (Signed)
Very Brief Summary of Hospital Course: You were admitted on 06/16/2016 and underwent right VATS wedge resection x 2 for bullae. You tolerated the procedure well. You had 2 right chest tubes in place postoperatively. One was removed 9/30, and after undergoing a short clamp trial your last CT was removed on 10/1.  You were ready for discharged home on 06/19/2016.     Call: Barrett HenleJones, Carolyn, MD promptly if you experience any of these symptoms:Shortness of breath, Fever of 101 F. or greater, Increased redness, drainage or swelling from incision site, diet intolerance, nausea, and vomiting.    If you cannot reach the provider above, then call doctors answering service: [phone]    If you have questions or issues prior to your follow up appointment, you can call the floor thoracic NP/PA Monday-Friday 7 am - 5 pm at [phone].     Diet: Regular     Activity:Walk, exercise, and use stairs as tolerated. Continue to use incentive spirometer frequently. Do not lift anything >10 lbs. No driving. Do not return to work until seen and cleared by Designer, industrial/productyour surgeon.    Wound Care:Keep dressing on chest tube site for 2 days after your chest tube was removed. You may shower after dressing is removed. Do not take a tub-bath for 2 weeks from the date of your surgery.     -It is very normal to have clear watery straw-colored or pink tinged drainage from the chest tube site several days after removal. This can sometimes be a sudden gush of fluid. Use dressings as needed if this happens until the drainage stops. Abnormal drainage is milky/pus and bleeding.     Pain Management Plan: per pre-written guidelines.  You may take ibuprofen 3 times daily for pain relief and oxycodone in addition for breakthrough pain. Take both with food to avoid stomach upset. You may take Tylenol 3-4 times daily in addition as there is no extra tylenol in the oxycodone (max Tylenol 3200 mg/day).     Bowel regimen (narcotic pain medication causes constipation):  Continue taking Colace and milk of magnesia while taking narcotic pain medication routinely to prevent constipation. You may stop taking if you develop loose stool.  If you become constipated despite this, add an extra dose of milk of magnesia/Miralax/prune juice to help stimulate your bowels. You should aim for a BM every other day or close to your usual routine prior to surgery. You may also obtain OTC dulcolax suppositories or magnesium citrate (take 150 ml at a time several hours apart x 2 doses) if necessary to increase the bowel regimen further.     Jerral RalphWendy C Cristal Howatt, PA 06/19/2016 9:31 AM

## 2016-06-19 NOTE — Progress Notes (Signed)
AVS reviewed with pt and pt daughter at bedside.  Pt asked appropriate questions and verbalized understanding.  IV removed. Dressed approprietly for weather.  Daughter to transport pt home.  Pt sent to discharge via wheelchair.  No other concerns at this time.      Lilyan GilfordLindy Kiaraliz Rafuse, RN  06/19/2016  10:37 AM

## 2016-06-19 NOTE — Progress Notes (Signed)
Report Given To  Lilyan GilfordLindy Zulick, RN      Descriptive Sentence / Reason for Admission     Pt admitted for R VATS wedge for cystic structure in the RLL    PMH: 30 year pack history, GERD, pneumonia, bronchitis, obesity, anxiety      Active Issues / Relevant Events   Both CTs removed  EES ointment for corneal abrasion to R eye  Oxycodone for pain control, IV Toradol      To Do List  Monitor VS  Monitor I&O  Monitor Labs  IS- 750, aim for 2000  Encourage Ambulation and Pulmonary Toileting    Anticipatory Guidance / Discharge Planning    D/c today    Thalia BloodgoodKelsey Donoghue, RN

## 2016-06-19 NOTE — Plan of Care (Signed)
Pain/Comfort     Patient's pain or discomfort is manageable Adequate for discharge    Pt pain controlled with tylenol & ibuprofen PRN.  Pt understands how to take oxycodone approprietly.  Educated on pain med regime for d/c.     Post-Operative Bladder Elimination     Patient is able to empty bladder or return to baseline Adequate for discharge    Pt voiding approprietly. No concerns.     Post-Operative Bowel Elimination     Elimination pattern is normal or improving Adequate for discharge    Pt +BS/flatus. No BM.     Safety     Patient will remain free of falls Adequate for discharge    Pt A&Ox3.  Uses call bell approprietly. No falls this shift.     Lilyan GilfordLindy Reata Petrov, RN  06/19/2016  10:40 AM

## 2016-06-19 NOTE — Progress Notes (Addendum)
Thoracic Surgery     Interval history:   CT removed yesterday, weaned to RA and saturating appropriately  Pain controlled. Tolerating diet. Voiding spontaneously. No BMs yesterday but +flatus. Has been ambulating. Denies f/c/SOB.    Medications:   Scheduled Meds:   enoxaparin  40 mg Subcutaneous Q24H    ketorolac  7.5 mg Intravenous Q6H    guaiFENesin  600 mg Oral 2 times per day    docusate sodium  100 mg Oral TID    magnesium hydroxide  30 mL Oral Daily    acetaminophen  1,000 mg Oral Q8H    amitriptyline  50 mg Oral Nightly    omeprazole  20 mg Oral QAM    lidocaine  1 patch Transdermal Q24H    erythromycin   Right Eye TID     Continuous Infusions:     PRN Meds:.HYDROmorphone PF, naloxone, ondansetron, oxyCODONE, oxyCODONE, albuterol, ARTIFICIAL TEARS    Physical Exam:   BP: (105-127)/(52-72)   Temp:  [36.6 C (97.9 F)-37.1 C (98.7 F)]   Temp src: Temporal (10/02 0335)  Heart Rate:  [88-111]   Resp:  [16]   SpO2:  [88 %-96 %]     General: A/O x 3, NAD  Cardiovascular: S1 and S2 Regular, no rub, gallop or mummer  Lungs: breathing comfortably on RA  Abdomen: soft non tender, non distended  Incision sites: Right VATS inc CDI with dermabond  Extremities:Warm and dry no edema      Labs:   Chemistry:    Recent Labs  Lab 06/17/16  0606   Sodium 137   Potassium 4.8   Chloride 98   CO2 28   UN 16   Creatinine 0.74   Glucose 142*   Calcium 8.7   Magnesium 1.9     Heme/coag:    Recent Labs  Lab 06/17/16  0606   WBC 10.1*   Hematocrit 35   Platelets 277       Imaging:   Chest Single Frontal View    Result Date: 06/18/2016  IMPRESSION:  Postsurgical changes in the right hemithorax.  Right-sided chest tube with tip near the apex.  Small persistent right apical pneumothorax.  END REPORT  I have personally reviewed the image(s) and the resident's interpretation and agree with or edited the findings.      *chest Standard Single View    Result Date: 06/17/2016  IMPRESSION:  Small right apical pneumothorax. Interval  removal of one of the chest tubes. One right apical chest tube remains in place.  END REPORT   I have personally reviewed the image(s) and the resident's interpretation and agree with or edited the findings.    *chest Standard Single View    Result Date: 06/16/2016  IMPRESSION:  Interval post-surgical changes of the right lung for previously seen large bulla. No radiographic evidence of pneumothorax.  END REPORT    *chest Standard Frontal And Lateral Views    Result Date: 06/18/2016  IMPRESSION:  Small persistent right apical pneumothorax. Asymmetric chest tube in place.  Postsurgical changes of the right hemithorax with elevated right hemidiaphragm.  END REPORT   I have personally reviewed the image(s) and the resident's interpretation and agree with or edited the findings.        Assessment and Plan:   3 Days Post-Op  Brianna Barnett is a 65 y.o.female former smoker, GERD and arthritis now status post Right VATS wedge staple blebectomy for giant bullae x 2.     F/E/N: regular  diet  Analgesia: Tylenol ATC, Lidoderm patch  Oxycodone and IV dilaudid prn breakthrough, ok for NSAIDS   CV: Remains HDS, CT to seal  Pulm: Aggressive pulmonary toilet, IS, Flutter Valve  GI: Bowel meds, Omeprazole   Renal: Voiding appropriately  DVT PPX: lovenox  PT/OT/Activity: OOB and ambulate  Dispo: home today        Diagnoses for this hospitalization include:  1. acute pulmonary insufficiency following surgery   2. Right Lung Bullous Disease     Robyn HaberYih Hsuan Shavontae Gibeault, MD  Thoracic surgery

## 2016-06-21 LAB — SURGICAL PATHOLOGY

## 2016-06-23 ENCOUNTER — Encounter: Payer: Self-pay | Admitting: Thoracic/Foregut Surgery

## 2016-06-23 ENCOUNTER — Ambulatory Visit: Payer: Self-pay | Admitting: Thoracic/Foregut Surgery

## 2016-06-23 ENCOUNTER — Ambulatory Visit
Admission: RE | Admit: 2016-06-23 | Discharge: 2016-06-23 | Disposition: A | Discharge status: Home / Self Care | Payer: Self-pay | Source: Ambulatory Visit | Attending: Thoracic/Foregut Surgery | Admitting: Thoracic/Foregut Surgery

## 2016-06-23 ENCOUNTER — Telehealth: Payer: Self-pay | Admitting: Thoracic/Foregut Surgery

## 2016-06-23 VITALS — BP 136/88 | HR 100 | Temp 95.6°F | Resp 20 | Ht 62.0 in | Wt 189.8 lb

## 2016-06-23 DIAGNOSIS — J439 Emphysema, unspecified: Principal | ICD-10-CM | POA: Diagnosis present

## 2016-06-23 NOTE — Addendum Note (Signed)
Addended by: Sandria BalesOLEMAN, Ranessa Kosta E on: 06/23/2016 08:46 AM     Modules accepted: Orders

## 2016-06-23 NOTE — Telephone Encounter (Signed)
Thoracic Surgery Staff Note    Calls in with 2 days of new pain. Lateral chest wall radiating to back with mildly worsening dyspnea. This is different from the first fews days postop. Will bring in for eval with NP and CXR.     Anaika Dutyhristian G Kadi Hession, MD  06/23/2016  7:40 AM

## 2016-06-23 NOTE — Progress Notes (Signed)
Today, I had the pleasure of seeing [name] in follow-up in the Thoracic / Foregut surgery clinic for CP following bullae resection.     As you are aware, she is a 65 y.o. female who is one week postop a right VATS bullae resection.  She called the office c/o a band like pain to the right lateral chest which she thinks is a spasm.  It improves with opioids.  She has no fevers and minimal SOB with excellent O2 sat on room air.  She is not tachypnic.    PROCEDURES PERFORMED:  Right VATS, Bullae resection    PATHOLOGY:    FINAL DIAGNOSIS:   (A) Lung, right lower lobe, wedge resection:   - Lung with bullae.     (B) Lung, right lower lobe, wedge resection #2:   - Lung with pleural plaque, acute and chronic pleuritis and bullae.     PHYSICAL EXAM:  Vitals: BP 136/88 (BP Location: Right arm, Patient Position: Sitting, Cuff Size: large adult)   Pulse 100   Temp 35.3 C (95.6 F) (Temporal)    Resp 20   Ht 1.575 m (5\' 2" )   Wt 86.1 kg (189 lb 12.8 oz)   SpO2 95%   BMI 34.71 kg/m2  Pain Score: 4/10      She is alert and oriented. Her face is symmetric, sclerae anicteric, and her pupils are equal, round, reactive to light bilaterally.  Her oropharyngeal mucosa is pink and moist.  She has no palpable cervical or supraclavicular lymphadenopathy.  Her lungs are clear to auscultation bilaterally, without any rhonchi, rales, or wheezes.  Her heart rate and rhythm are regular, without any murmurs.  Her abdomen is soft and nondistended, without any obvious hepatosplenomegaly.  Her lower extremities are warm and without any edema.    Incision: healing well No crepitus or drainage    RECORD REVIEW/IMAGING:  CXR shows no PTX and small to moderate effusion with atelectasis in the right base    Plan     In summary, [name] is a 65 y.o. female with probable muscle spasm following right VATS.  There is no PTX and the effusion is not unexpected postop.  I have reassured her and told her to continue her regiment of  tylenol, NSAIDS, and opioids   If there is no improvement in the next couple days will consider Neurontin.  I will follow up in one week with repeat CXR       Lezlie Lyearolyn Hemingford,MD

## 2016-06-28 ENCOUNTER — Ambulatory Visit: Payer: Self-pay | Admitting: Thoracic/Foregut Surgery

## 2016-06-28 ENCOUNTER — Ambulatory Visit
Admission: RE | Admit: 2016-06-28 | Discharge: 2016-06-28 | Disposition: A | Discharge status: Home / Self Care | Payer: Self-pay | Source: Ambulatory Visit | Attending: Thoracic/Foregut Surgery | Admitting: Thoracic/Foregut Surgery

## 2016-06-28 VITALS — BP 186/85 | HR 104 | Temp 96.4°F | Resp 16 | Ht 62.0 in | Wt 189.9 lb

## 2016-06-28 DIAGNOSIS — J439 Emphysema, unspecified: Secondary | ICD-10-CM

## 2016-06-28 DIAGNOSIS — R079 Chest pain, unspecified: Secondary | ICD-10-CM

## 2016-06-28 MED ORDER — GABAPENTIN 100 MG PO CAPSULE *I*
100.0000 mg | ORAL_CAPSULE | Freq: Three times a day (TID) | ORAL | 1 refills | Status: DC
Start: 2016-06-28 — End: 2016-08-05

## 2016-06-28 NOTE — Student Note (Signed)
STUDENT NOTE- Thoracic and Foregut Surgery Progress Note     Today, I had the pleasure of seeing [name] in follow-up in the Thoracic / Foregut surgery clinic for a one week post operative visit and chest pain following a R VATS wedge resection of 8.6 cm pneumatocele.     As you are aware, she is a 65 y.o. female who has a history of GERD and a right lung pneumatocele who is day 26 post op from a R VATS wedge resection on 9/29.  She came to clinic on 10/6 for probable muscle spasm, which has been controlled with Tylenol, NSAIDS and opioids which decrease her pain from an 8/10 to "almost nothing."  She says the feeling of spasm has turned into a sharp pain at the right costal margin that moves into her R flank and back.  It is worsened by movement and deep breathing and it awakens her at night.  She has been restricted to shallow breaths due to the pain and has occasional nausea as well. Following the surgery she had low oxygen saturations that resolved with incentive spirometry and she went home on RA, but she has had to sleep with her bed at 45 degrees or else she is unable to breathe. She also endorses a nonproductive cough. She also has subjective fevers and chills in the evenings.  She has not been able to eat much following surgery due to decreased appetite. She does have bowel flatus and bowel movements.        PROCEDURES PERFORMED: none today     PATHOLOGY:      FINAL DIAGNOSIS:   (A) Lung, right lower lobe, wedge resection:   - Lung with bullae.     (B) Lung, right lower lobe, wedge resection #2:   - Lung with pleural plaque, acute and chronic pleuritis and bullae.     PHYSICAL EXAM:  Vitals: BP (!) 186/85   Pulse 104   Temp 35.8 C (96.4 F) (Temporal)    Resp 16   Ht 1.575 m (5\' 2" )   Wt 86.1 kg (189 lb 14.4 oz)   SpO2 96%   BMI 34.73 kg/m2  Pain Score: 8/10      She is alert and oriented. Her face is symmetric, sclerae anicteric, and her pupils are equal, round, reactive to light bilaterally.  Her  oropharyngeal mucosa is pink and moist.  She has no palpable cervical or supraclavicular lymphadenopathy.  Her lungs are clear to auscultation bilaterally, without any rhonchi, rales, or wheezes.  Her heart rate and rhythm are regular, without any murmurs.  Her abdomen is soft and nondistended, without any obvious hepatosplenomegaly.  Her lower extremities are warm and without any edema.    Incision: 1/2 cm erythema surrounding inferior two incisions, sutures still in place.     RECORD REVIEW/IMAGING:  CXR from 10/6 showed no apical PNX     ASSESSMENT AND PLAN     In summary, Brianna Barnett is a 65 y.o. female with with pleuritic, R sided chest pain following R VATS that has persisted in spite of oral analgesics. While it is likely to improve over time, we recommend that she try Neurotonin for the pain in the hopes that it will improve her breathing since nerve intercostal nerve injury is possible from VATS surgeries. We also removed her stitches on the 2 inferior incisions.  She will get a chest xray when she leaves today that we will review.      Brianna Barnett  Brianna Barnett, MS3  This is a Psychologist, occupationalmedical student note. Please see attending note for final assessment and plan.

## 2016-06-28 NOTE — Progress Notes (Signed)
Brianna Barnett, M.D.  Associate Professor of Surgery  Division of Thoracic / Foregut Surgery    06/28/2016    RE: Brianna Barnett  DOB: 1950-10-24  MRN: [id number]    Assessment     Today, I had the pleasure of seeing Brianna Barnett in the Thoracic / Foregut surgery clinic for a follow up appointment.    As you are aware, she is a 65 y.o. 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    She presents today with her daughter for a postoperative follow up visit. She is experiencing fatigue, nausea, night sweats, and constant aches in her right anterior chest inferior to her breast that radiates around her right flank to her spine. She mentions that this pain is preventing her from taking a deep breath as well as causing her to get poor sleep at night. She rates this pain as an 8 out of 10. She is managing her pain with oxycodone 5 mg every 4 hours as needed, Tylenol 500 mg every 8 hours, and ibuprofen 200 mg as needed. From a pulmonary perspective, she is experiencing a slight dry cough and exertional dyspnea. She reports that she can not sleep laying flat so she has elevated the head of her bed. Her appetite is poor, but her weight is stable. She denies hemoptysis, headaches, visual changes, and vertigo.    PROCEDURES PERFORMED:  06/16/2016: Right VATS, Bullae resection    PATHOLOGY:    FINAL DIAGNOSIS:   (A) Lung, right lower lobe, wedge resection:   - Lung with bullae.   (B) Lung, right lower lobe, wedge resection #2:   - Lung with pleural plaque, acute and chronic pleuritis and bullae.       PHYSICAL EXAM:  Vitals: BP (!) 186/85   Pulse 104   Temp 35.8 C (96.4 F) (Temporal)    Resp 16   Ht 1.575 m (5\' 2" )   Wt 86.1 kg (189 lb 14.4 oz)   SpO2 96%   BMI 34.73 kg/m2  Pain Score: 8/10      She is alert and oriented. Her face is symmetric, sclerae anicteric, and her pupils are equal, round, reactive to light bilaterally.  Her oropharyngeal mucosa  is pink and moist.  She has no palpable cervical or supraclavicular lymphadenopathy.  Her lungs are clear to auscultation bilaterally, without any rhonchi, rales, or wheezes.  Her heart rate and rhythm are regular, without any murmurs.  Her abdomen is soft and nondistended, without any obvious hepatosplenomegaly.  Her lower extremities are warm and without any edema.    Incision: well healed    RECORD REVIEW/IMAGING:    Chest x-ray - 06/28/2016 - Reviewed  none    Plan     In summary, Brianna Barnett is a 65 y.o. 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 She is doing well, except for some lingering incisional pain, that is most likely nerve associated pain. To further manage her pain, I will prescribe her with gabapentin 100 mg three times per day. Additionally, I would like her to begin reducing her narcotics as they are most likely attributing to her nausea. I will have her undergo a chest x-ray after her clinic visit today to ensure stability. I will call to notify Brianna Barnett of any pertinent findings. I invited and answered all questions to the patient's  satisfaction. I will plan to follow up with Brianna Barnett in 4 weeks to ensure she is recovering appropiately.       Brianna Henlearolyn [place], Brianna Barnett    Attestations:  Trina AoI, Matthew Mihalik, am scribing for and in the presence of Dr. Yetta BarreJones.   06/28/16 1:50 PM     I, Brianna HenleAROLYN [place], Brianna Barnett, personally performed the services described in this documentation, as scribed by the scribe listed above in my presence, and it is accurate and complete.    Brianna HenleAROLYN [place], Brianna Barnett    07/02/16 12:00 AM

## 2016-07-07 ENCOUNTER — Encounter: Payer: Self-pay | Admitting: Thoracic Diseases

## 2016-07-20 ENCOUNTER — Telehealth: Payer: Self-pay | Admitting: Thoracic/Foregut Surgery

## 2016-07-20 NOTE — Telephone Encounter (Signed)
Patient calling stating she is going out of state to visit her grandson [place] [employer] [place] on 11[place]14 and asking if she can get a temporary handicap parking pass [place]. Please advise.

## 2016-07-20 NOTE — Telephone Encounter (Signed)
LM letting patient know we are unable to provide out of state handicap parking pass.    Unfortunately, I can't do this out of state :(

## 2016-08-01 ENCOUNTER — Other Ambulatory Visit: Payer: Self-pay | Admitting: Thoracic/Foregut Surgery

## 2016-08-01 DIAGNOSIS — R079 Chest pain, unspecified: Secondary | ICD-10-CM

## 2016-08-05 ENCOUNTER — Other Ambulatory Visit: Payer: Self-pay | Admitting: Thoracic Diseases

## 2016-08-08 ENCOUNTER — Ambulatory Visit: Payer: Self-pay | Admitting: Thoracic/Foregut Surgery

## 2016-08-08 ENCOUNTER — Ambulatory Visit
Admission: RE | Admit: 2016-08-08 | Discharge: 2016-08-08 | Disposition: A | Discharge status: Home / Self Care | Payer: Self-pay | Source: Ambulatory Visit | Attending: Thoracic/Foregut Surgery | Admitting: Thoracic/Foregut Surgery

## 2016-08-08 ENCOUNTER — Encounter: Payer: Self-pay | Admitting: Thoracic/Foregut Surgery

## 2016-08-08 VITALS — BP 163/79 | HR 101 | Temp 97.5°F | Resp 17 | Ht 62.0 in | Wt 190.9 lb

## 2016-08-08 DIAGNOSIS — J439 Emphysema, unspecified: Secondary | ICD-10-CM

## 2016-08-08 MED ORDER — GABAPENTIN 100 MG PO CAPSULE *I*
200.0000 mg | ORAL_CAPSULE | Freq: Three times a day (TID) | ORAL | 0 refills | Status: DC
Start: 2016-08-08 — End: 2017-05-04

## 2016-08-08 NOTE — Progress Notes (Signed)
Barrett Henlearolyn Hingham, M.D.  Associate Professor of Surgery  Division of Thoracic / Foregut Surgery    08/08/2016    RE: Brianna Barnett  DOB: 08/03/1951  MRN: [id number]    Assessment     Today, I had the pleasure of seeing Brianna Barnett in the Thoracic / Foregut surgery clinic for a follow up appointment.    As you are aware, she is a 65 y.o. 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    She presents today for a follow up visit. She is still experiencing paresthesias in her right lower back that she describes as a stabbing/burning sensation. From a pulmonary perspective, she is experiencing a deep cough that has been persistent since surgery. She mentions that when she coughs, she has pain that radiates through her chest cavity to her back. Her appetite is good and her weight is stable. [place]. She denies fever, chills, night sweats, dyspnea, hemoptysis, and any recent respiratory infections.     PROCEDURES PERFORMED:  06/16/2016: Right VATS, Bullae resection    PATHOLOGY:  FINAL DIAGNOSIS:   (A) Lung, right lower lobe, wedge resection:   - Lung with bullae.   (B) Lung, right lower lobe, wedge resection #2:   - Lung with pleural plaque, acute and chronic pleuritis and bullae.       PHYSICAL EXAM:  Vitals: BP 163/79 (BP Location: Right arm, Patient Position: Sitting, Cuff Size: adult)   Pulse 101   Temp 36.4 C (97.5 F) (Temporal)    Resp 17   Ht 1.575 m (5\' 2" )   Wt 86.6 kg (190 lb 14.4 oz)   SpO2 97%   BMI 34.92 kg/m2    She is alert and oriented. Her face is symmetric, sclerae anicteric, and her pupils are equal, round, reactive to light bilaterally.  Her oropharyngeal mucosa is pink and moist.  She has no palpable cervical or supraclavicular lymphadenopathy.  Her lungs are clear to auscultation bilaterally, without any rhonchi, rales, or wheezes.  Her heart rate and rhythm are regular, without any murmurs.   Her abdomen is soft and nondistended, without any obvious hepatosplenomegaly.  Her lower extremities are warm and without any edema.    Incision: well healed    RECORD REVIEW/IMAGING:    CXR - 08/08/2016 -   Improved effusion, no infiltrate.  Lung expanded    Plan     In summary, Brianna Barnett is a 65 y.o. 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 She is doing well. Her chest x-ray from earlier today revealed a persistent right sided pleural effusion, but with interval improvement since last study. There was no evidence of pneumonia or pneumothorax. I will increase her gabapentin dosage to 200 mg three times daily to help with her right lower back paresthesias as well as her persistent cough. Since she is doing well, I do not believe that she needs a routine follow up with me. However, If her symptoms or condition change I would be happy to see her back in clinic. I invited and answered all questions to the patient's satisfaction. I will plan to follow up with Brianna Barnett as needed.      Barrett Henlearolyn [place], MD    Attestations:  Trina AoI, Matthew Mihalik, am scribing for and in the presence of Dr. Yetta BarreJones.   08/08/16  9:49 AM     I, Barrett HenleAROLYN [place], MD, personally performed the services described in this documentation, as scribed by the scribe listed above in my presence, and it is accurate and complete.    Barrett HenleAROLYN [place], MD    08/08/16 2:51 PM

## 2016-09-03 ENCOUNTER — Other Ambulatory Visit: Payer: Self-pay | Admitting: Nurse Practitioner

## 2017-01-24 ENCOUNTER — Other Ambulatory Visit: Payer: Self-pay | Admitting: Primary Care

## 2017-01-24 MED ORDER — AMITRIPTYLINE HCL 50 MG PO TABS *I*
ORAL_TABLET | ORAL | 5 refills | Status: DC
Start: 2017-01-24 — End: 2017-02-23

## 2017-01-24 NOTE — Telephone Encounter (Signed)
Fax received. States take 3 tablets by mouth at bedtime. However, medication order in e-record states take one by mouth at bedtime.

## 2017-01-24 NOTE — Telephone Encounter (Signed)
Please clarify with correct dosing and ex if okay

## 2017-02-23 ENCOUNTER — Other Ambulatory Visit: Payer: Self-pay | Admitting: Primary Care

## 2017-02-23 DIAGNOSIS — F419 Anxiety disorder, unspecified: Secondary | ICD-10-CM

## 2017-02-23 MED ORDER — AMITRIPTYLINE HCL 50 MG PO TABS *I*
ORAL_TABLET | ORAL | 1 refills | Status: DC
Start: 2017-02-23 — End: 2017-08-05

## 2017-02-23 NOTE — Telephone Encounter (Signed)
Pharmacy requesting 90 days, please erx, next ov 7/27

## 2017-02-26 ENCOUNTER — Other Ambulatory Visit: Payer: Self-pay | Admitting: Primary Care

## 2017-03-13 ENCOUNTER — Encounter: Payer: Self-pay | Admitting: Primary Care

## 2017-03-23 ENCOUNTER — Encounter: Payer: Self-pay | Admitting: Primary Care

## 2017-03-23 DIAGNOSIS — R252 Cramp and spasm: Secondary | ICD-10-CM

## 2017-03-23 DIAGNOSIS — R739 Hyperglycemia, unspecified: Secondary | ICD-10-CM

## 2017-03-23 DIAGNOSIS — R7611 Nonspecific reaction to tuberculin skin test without active tuberculosis: Secondary | ICD-10-CM | POA: Insufficient documentation

## 2017-03-23 DIAGNOSIS — E785 Hyperlipidemia, unspecified: Secondary | ICD-10-CM

## 2017-03-23 DIAGNOSIS — G4700 Insomnia, unspecified: Secondary | ICD-10-CM | POA: Insufficient documentation

## 2017-03-23 DIAGNOSIS — I050 Rheumatic mitral stenosis: Secondary | ICD-10-CM

## 2017-04-13 ENCOUNTER — Ambulatory Visit: Payer: Medicare (Managed Care) | Admitting: Primary Care

## 2017-04-24 ENCOUNTER — Emergency Department: Payer: Medicare (Managed Care)

## 2017-04-24 ENCOUNTER — Other Ambulatory Visit: Payer: Self-pay | Admitting: Cardiology

## 2017-04-24 ENCOUNTER — Emergency Department
Admission: EM | Admit: 2017-04-24 | Discharge: 2017-04-24 | Disposition: A | Discharge status: Home / Self Care | Payer: Medicare (Managed Care) | Source: Ambulatory Visit | Attending: Emergency Medicine | Admitting: Emergency Medicine

## 2017-04-24 ENCOUNTER — Encounter: Payer: Self-pay | Admitting: Emergency Medicine

## 2017-04-24 DIAGNOSIS — R51 Headache: Secondary | ICD-10-CM

## 2017-04-24 DIAGNOSIS — Y998 Other external cause status: Secondary | ICD-10-CM | POA: Insufficient documentation

## 2017-04-24 DIAGNOSIS — W01198A Fall on same level from slipping, tripping and stumbling with subsequent striking against other object, initial encounter: Secondary | ICD-10-CM

## 2017-04-24 DIAGNOSIS — W19XXXA Unspecified fall, initial encounter: Secondary | ICD-10-CM

## 2017-04-24 DIAGNOSIS — Y92008 Other place in unspecified non-institutional (private) residence as the place of occurrence of the external cause: Secondary | ICD-10-CM | POA: Insufficient documentation

## 2017-04-24 DIAGNOSIS — S40811A Abrasion of right upper arm, initial encounter: Secondary | ICD-10-CM

## 2017-04-24 DIAGNOSIS — Y9389 Activity, other specified: Secondary | ICD-10-CM | POA: Insufficient documentation

## 2017-04-24 DIAGNOSIS — S0990XA Unspecified injury of head, initial encounter: Secondary | ICD-10-CM

## 2017-04-24 DIAGNOSIS — T07XXXA Unspecified multiple injuries, initial encounter: Secondary | ICD-10-CM

## 2017-04-24 DIAGNOSIS — M7989 Other specified soft tissue disorders: Secondary | ICD-10-CM

## 2017-04-24 DIAGNOSIS — I499 Cardiac arrhythmia, unspecified: Secondary | ICD-10-CM

## 2017-04-24 DIAGNOSIS — R519 Headache, unspecified: Secondary | ICD-10-CM

## 2017-04-24 DIAGNOSIS — W109XXA Fall (on) (from) unspecified stairs and steps, initial encounter: Secondary | ICD-10-CM | POA: Insufficient documentation

## 2017-04-24 DIAGNOSIS — I6782 Cerebral ischemia: Secondary | ICD-10-CM

## 2017-04-24 MED ORDER — ACETAMINOPHEN 500 MG PO TABS *I*
500.0000 mg | ORAL_TABLET | Freq: Four times a day (QID) | ORAL | 0 refills | Status: DC | PRN
Start: 2017-04-24 — End: 2017-05-04

## 2017-04-24 MED ORDER — ACETAMINOPHEN 500 MG PO TABS *I*
1000.0000 mg | ORAL_TABLET | Freq: Once | ORAL | Status: AC
Start: 2017-04-24 — End: 2017-04-24
  Administered 2017-04-24: 1000 mg via ORAL
  Filled 2017-04-24: qty 2

## 2017-04-24 NOTE — Discharge Instructions (Signed)
You have been seen today after a mechanical fall off of a step. Your head CT was negative for any bleeding or large bruises. Your EKG was unremarkable and we are comfortable discharging you to follow up with neurology and your primary care doctor within the next 1-2 weeks for a re evaluation of your several years of balance problems. Please return for weakness, changes in vision, vomiting, numbness, chest pain or any other new or concerning signs or symptoms. We have placed a referral to the neurologist to call you within the next week - they should call you to set up an appointment within the next week

## 2017-04-24 NOTE — First Provider Contact (Signed)
ED First Provider Contact Note    Initial provider evaluation performed by   ED First Provider Contact     Date/Time Event User Comments    04/24/17 1647 ED First Provider Contact Lilliam Chamblee, [facility] Initial Face to Face Provider Contact        [phone] female presents with headache after mechanical fall onto concrete prior to arrival. Missed a step striking R side of head. No LOC. Denies additional injury. No neck pain. Not anti-coagulated.    Vital signs reviewed.    Orders placed:  CT head     Patient requires further evaluation.     Virgina EvenerMelinda F Sowmya Partridge, [place], 04/24/2017, 4:47 PM    Supervising physician Dr Arletha GrippeS Nasser was immediately available     Virgina EvenerMetcalfe, Vira Chaplin F, [place]  04/24/17 1651

## 2017-04-24 NOTE — ED Provider Notes (Signed)
History     Chief Complaint   Patient presents with    Fall   66 yo F hx gerd, arthritis, s/p VATS wedge resection of lung bullae (2017), "i broke both legs once" and chronic balance problems (6 years) presents with cc fall. Patient is sure she hit her head yet she is unclear where she hit it because of possible transient loss of consciousness. Patient's daughter was walking in front of mother and heard fall, immediately turned around and saw her mother on the ground. No shaking or changes in tone. No focal weakness. No cp, sob, abdominal pain numbness or weakness before incident or now. No worsening of balance issues over the last several days or weeks. No unwanted weight loss, night sweats. Patient endorses ha now, generalized since episode but denies hx headaches recently. No vision changes. No bowel or bladder issues. No midline neck or back pain. No changes to chronic hip and knee pain. No recent palpitations syncope or near syncope. 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    Other pulmonary insufficiency, not elsewhere classified 06/06/2016        Patient Active Problem List   Diagnosis Code    Other pulmonary insufficiency, not elsewhere classified J98.4    GERD (gastroesophageal reflux disease) K21.9    Arthritis M19.90    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    Cramps of left lower extremity R25.2    Hyperglycemia R73.9    Hyperlipidemia E78.5    Insomnia G47.00    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    Mitral valve stenosis I05.0            Past Surgical History:   Procedure Laterality Date    DENTAL SURGERY      esophageal dilatation      EYE SURGERY Right     for detached retina    KNEE SURGERY Left     3 times    PR THORACOSCOPY W/THERA WEDGE RESEXN  INITIAL UNILAT Right 06/16/2016    Procedure: THORACOSCOPY (VATS) Wedge;  Surgeon: Barrett HenleJones, Carolyn, MD;  [facility];  Service: Other    TONSILLECTOMY       Family History   Problem Relation Age of Onset    Heart Disease Mother     GERD Mother     Arthritis Mother     COPD Mother     Bleeding prob Brother           Social History   Substance Use Topics    Smoking status: Former Smoker     Packs/day: 0.75     Years: 37.00     Types: Cigarettes     Quit date: 09/19/1999    Smokeless tobacco: Never Used    Alcohol use Yes      Comment: socially     Living Situation     Questions Responses    Patient lives with     Homeless     Caregiver for other family member     External Services     Employment     Domestic Violence Risk  Review of Systems   Review of Systems   Constitutional: Negative for activity change, chills and fever.   HENT: Negative for congestion and sore throat.    Eyes: Negative for discharge and redness.   Respiratory: Negative for chest tightness and shortness of breath.    Cardiovascular: Negative for chest pain and palpitations.   Gastrointestinal: Negative for abdominal pain and nausea.   Genitourinary: Negative for dysuria, frequency and hematuria.   Musculoskeletal: Positive for arthralgias and gait problem. Negative for back pain, joint swelling, neck pain and neck stiffness.   Skin: Negative for color change and rash.   Allergic/Immunologic: Negative for immunocompromised state.   Neurological: Positive for headaches. Negative for dizziness, tremors, seizures, syncope, facial asymmetry, speech difficulty, weakness, light-headedness and numbness.   Hematological: Does not bruise/bleed easily.   Psychiatric/Behavioral: Negative for confusion.       Physical Exam     Triage Vitals  Triage Start: Start, (04/24/17 1646)   First Recorded BP: 152/78, Resp: 18, Temp: 36.4 C (97.5 F), Temp src: TEMPORAL Oxygen Therapy SpO2: 95 %, Oximetry Source: Lt Hand, O2 Device: None  (Room air), Heart Rate: 100, (04/24/17 1646)  .  First Pain Reported  0-10 Scale: 4, Pain Location/Orientation: Head, (04/24/17 1646)       Physical Exam   Constitutional: She is oriented to person, place, and time. She appears well-developed and well-nourished. No distress.   HENT:   Head: Normocephalic and atraumatic.   Nose: Nose normal.   No hematomas or skin color changes to scalp   Eyes: EOM are normal. Pupils are equal, round, and reactive to light. No scleral icterus.   Neck: Normal range of motion. No tracheal deviation present. No thyromegaly present.   Cardiovascular: Normal rate and intact distal pulses.    Pulmonary/Chest: Effort normal. No stridor. No respiratory distress.   Abdominal: Soft. She exhibits no distension.   Musculoskeletal: Normal range of motion. She exhibits no edema or deformity.   Neurological: She is alert and oriented to person, place, and time. No cranial nerve deficit. She exhibits normal muscle tone. Coordination normal.   Normal finger to nose. Patient has unsteady wide based gait. Daughter states this is mother's baseline. No shuffling, normal tone. Unclear walked differently before she broke both legs.   Skin: Skin is warm. No rash noted. She is not diaphoretic. No erythema.   Psychiatric: She has a normal mood and affect. Her behavior is normal.   Nursing note and vitals reviewed.      Medical Decision Making        Initial Evaluation:  ED First Provider Contact     Date/Time Event User Comments    04/24/17 1647 ED First Provider Contact [place], [facility] Initial Face to Face Provider Contact          Patient seen by me on arrival date of 04/24/2017.    Assessment:  66 y.o.female comes to the ED with fall injury and ha. At her baseline neuro. Has chronic (6 years) balance issues likely contributing to fall.       Differential Diagnosis includes:  Mechanical fall, headache      Plan: tylenol, ct head, ekg, d/c w/o/p f/u and return precautions    Mayra Neer,  MD    Patient and daughter feel comfortable with disposition plan and have no further questions        Dixie Jafri, Gaspar Cola, MD  04/26/17 [phone]

## 2017-04-24 NOTE — ED Notes (Addendum)
Pt fell at daughters house stepping off of cement step. Fell onto right side of body. Abrasion noted to right forearm. Endorses hitting her head. Pt believes she lost consciousness for a "few minutes". Denies numbness/tingling and changes in vision. CT negative on arrival to chair. Will continue to monitor and treat per provider orders.

## 2017-04-24 NOTE — ED Triage Notes (Signed)
Patient had witnessed mechanical fall.  Did not lose consciousness- but does not remember the fall.  Pupils equal and reactive.  Complaining of right sided head pain. Denies neck pain/c-spine tenderness.  Not on blood thinners. Alert and oriented.        Triage Note   Humphrey RollsKelsie Anderson, RN

## 2017-04-24 NOTE — ED Notes (Signed)
Discharge and follow up instructions reviewed with pt. Pt verbalized understanding. VS stable. Pt ambulatory out of ED with appropriate clothing in place for weather.

## 2017-04-25 ENCOUNTER — Telehealth: Payer: Self-pay | Admitting: Primary Care

## 2017-04-25 LAB — EKG 12-LEAD
P: 66 deg
PR: 208 ms
QRS: -21 deg
QRSD: 104 ms
QT: 388 ms
QTc: 462 ms
Rate: 85 {beats}/min
Severity: NORMAL
T: 262 deg

## 2017-04-25 NOTE — Telephone Encounter (Signed)
Called patient to see how she is feeling. LVMM for her to call us back.

## 2017-04-26 ENCOUNTER — Telehealth: Payer: Self-pay | Admitting: Primary Care

## 2017-04-26 NOTE — Telephone Encounter (Signed)
Spoke to Brianna Barnett. She reports that she is feeling better. She has had balance issues for a while, and fell at her daughters house on cement stairs. Had a CT while in Emergency. She needs a follow up with Dr.Ellis. She is aware that Dr.Ellis is not available for 2 weeks. Please call her to schedule next available appt.

## 2017-04-26 NOTE — Telephone Encounter (Signed)
Contacted pt no openings until 9/24 at noon also put her on wait list for cancellations

## 2017-04-26 NOTE — Telephone Encounter (Signed)
Telephone call to patient to follow up [facility] due to [facility]. Left message to call writer.

## 2017-04-26 NOTE — Telephone Encounter (Signed)
Attempted to reach patient to [facility]. Left message for patient to call writer.

## 2017-04-27 NOTE — Telephone Encounter (Addendum)
[facility]. Return call from patient who states she is "feeling 100% better". Patient states the abrasion on her right forearm is "looking better" "there's little scabs on it now". Denies drainage, increased redness. Patient states she still has a "little headache" but has not yet used the recommended Tylenol. Patient states the HA is "not bad like it was, it's just a little HA". Denies dizziness, vision changes, and is speaking clearly and appropriately. Patient offered a follow up visit in the office but declined stating "I'm really doing okay and I'll call if I need to".    Hospital Admission Date & Dx:  04/24/17 Contusions/abrasion, HA due to a fall  Hospital or SNF Discharge Date: 04/24/17   Disposition and Primary Caregiver:  Discharged to home, caring for self with family help if she needs it  PCP follow up appt:  Declined PCP follow up ED visit despite encouragement by Clinical research associatewriter. Has scheduled appointment with PCP in September  Home Care Agency:  None at this time  Home Care Services being rendered:  None at this time  Equipment needs:  None at this time  Medication Reconciliation:  Reviewed active medication list with patient who states she is taking all listed medications as directed except does not take Oxycodone  New Medications:  Tylenol ordered but patient is not taking it. Encouraged to do so to help with HA  Stopped Medications:  Oxycodone  Future Labs needed:  None at this time  Future Imaging:  None at this time  Care Management needs:  None at this time

## 2017-05-04 ENCOUNTER — Encounter: Payer: Self-pay | Admitting: Student in an Organized Health Care Education/Training Program

## 2017-05-04 ENCOUNTER — Ambulatory Visit
Payer: Medicare (Managed Care) | Attending: Neurology | Admitting: Student in an Organized Health Care Education/Training Program

## 2017-05-04 VITALS — BP 138/88 | HR 97 | Ht 62.0 in | Wt 195.4 lb

## 2017-05-04 DIAGNOSIS — F419 Anxiety disorder, unspecified: Secondary | ICD-10-CM

## 2017-05-04 DIAGNOSIS — R41840 Attention and concentration deficit: Secondary | ICD-10-CM | POA: Insufficient documentation

## 2017-05-04 NOTE — Patient Instructions (Signed)
Thank you for coming to the neurology clinic.   Please remember to obtain your blood work: TSH, B12, and CBC. Please make an appointment.

## 2017-05-04 NOTE — Progress Notes (Addendum)
Neurology Clinic: New Patient Visit      History of Present Illness:    Brianna Barnett is 66 y.o. female with a history notable for anxiety, recent bullectomy of right upper lung (Sept. 2017) with frequent falls s/p 2 ankle surgeries (injured separately) and 3 surgeries on left knee (fpr meniscal, ACL, MCL tears) who presents for evaluation of memory and gait issues.     1) Memory: new complaint. which she felt had been declining for the last 18 months, but more profound this month. She is worried as her mother had Alzheimer's and presented at around her age. No visitor with her today to elaborate on her condition. She is able to balance check books and pay own bills. Had no difficulty finding her way to this clinic. Was lost once driving without GPS. She was especially concerned for memory when she was at the ED last week for a fall. She remembers being at a party when she suddenly blacked out and remembers waking up on the ground. Denies head injury as she fell on her right arm. No history of seizures and syncope. She feels she has difficulty finding the words to say.     2) Balance - Chronic. She was an active volleyball player 15 years ago and had knee injury which severely limited her mobility and she walked differently. She unintentionally "Crushed all the bones my left foot" as well as her right ankle on a separate occasion from tripping on ice. She can go up the stairs, goes down stairs more slowly. She does not go on escalators after her b/l ankle injuries because she gets too anxious to coordinate timing. She denies being lightheaded. Has chronic HA. Usually falls to the left.    Pertinent ROS: No vision changes. No blind spots. No N/V, SOB at rest, CP. Has joint pain, especially in left knee. No weight changes. Endorses dry mouth and eyes.    Review of Systems:    A 15-point ROS questionnaire was filled out by the patient, reviewed by me, and has been scanned into the patient's electronic  medical record.  Pertinent negatives and positives were included in the HPI.      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   Alcohol: 2 glasses of wine daily.   No illicit  substances.  Lives at retired community. Her son is a history Runner, broadcasting/film/video, grandson is a Charity fundraiser. She is a former bookkeeper.     Family history   Pertinent for mother with Alzheimer's, who presented with early symptoms around her age.    Social History     Social History    Marital status: Divorced     Spouse name: N/A    Number of children: N/A    Years of education: N/A     Social History Main Topics    Smoking status: Former Smoker     Packs/day: 0.75     Years: 37.00     Types: Cigarettes     Quit date: 09/19/1999    Smokeless tobacco: Never Used    Alcohol use Yes      Comment: socially    Drug use: No    Sexual activity: Not Asked     Other Topics Concern    None     Social History Narrative        Medications:     Prior to Admission medications    Medication Sig Start Date End Date Taking? Authorizing Provider   acetaminophen (TYLENOL) 500 mg  tablet Take 1 tablet (500 mg total) by mouth every 6 hours as needed for Pain 04/24/17 05/24/17  Nasserjulsrud, Gaspar Cola, MD   omeprazole (PRILOSEC) 20 MG capsule Take 20 mg by mouth 2 times daily    [provider]   Cholecalciferol 2000 units TABS Take 1 tablet by mouth daily    [provider]   tiotropium (SPIRIVA) 18 MCG inhalation capsule Inhale 18 mcg into the lungs daily    [provider]   amitriptyline (ELAVIL) 50 MG tablet Take 3 tabs at night 02/23/17   Hetty Ely, MD   GABAPENTIN 100 MG capsule TAKE ONE CAPSULE BY MOUTH 3 TIMES A DAY 09/04/16   Sandria Bales, NP   gabapentin (GABAPENTIN) 100MG  capsule Take 2 capsules (200 mg total) by mouth 3 times daily 08/08/16   Artelia Laroche, NP   ibuprofen (ADVIL,MOTRIN) 200 MG tablet Take 1 tablet (200 mg total) by mouth 3 times daily as needed for Pain   Do not take with prescription strength ibuprofen 06/19/16   Jerral , PA   acetaminophen (TYLENOL) 500 mg tablet Take 2 tablets (1,000 mg total) by mouth every 8 hours 06/18/16   Mestnik, Audie Box, NP   ARTIFICIAL TEARS (LUBRIFRESH PM,GENTEAL PM) 83-15 % ointment Right eye as needed 06/18/16   Mestnik, Audie Box, NP   magnesium hydroxide (MILK OF MAGNESIA) 400 MG/5ML suspension Take 30 mLs by mouth daily 06/18/16   Mestnik, Audie Box, NP   oxyCODONE (ROXICODONE) 5 MG immediate release tablet Take 1-2 tablets (5-10 mg total) by mouth every 4 hours as needed for Pain   Max daily dose: 60 mg 06/18/16   Mestnik, Audie Box, NP   omeprazole (PRILOSEC) 10 MG capsule Take 10 mg by mouth at bedtime    [provider]   PROAIR HFA 108 (90 BASE) MCG/ACT inhaler Inhale 2 puffs into the lungs every 4-6 hours as needed    03/08/16   [provider]     Physical Examination:      Vitals:    05/04/17 1255   BP: 138/88   Pulse: 97   Weight: 88.6 kg (195 lb 6.4 oz)   Height: 1.575 m (5\' 2" )       General: Well, pleasant appearing 66 y.o.  female, who appears anxious.      Neurologic  Examination  Mental Status: Awake, alert, and but not attentive. Fluency, naming, repetition, comprehension intact. Affect consistent with anxious mood.     MOCA: 25/30. Please see the MOCA scanned into the system.   Lost 1 pt for visual spatial(drawing the sequence).   Lost 1 pt for abstraction.   Lost 3 points for delayed recall.     Cranial Nerves:       II:  Fundi visualized. Pupils 3/3 to 2/2. VF full to finger counting.    III/IV/VI: Versions full and intact without nystagmus.     V: Facial sensation intact to light touch.     VII: Face appears symmetric, eyes resist opening.    VIII: Hearing grossly intact.     IX/X: Palate elevates symmetrically.    XI: Shoulder shrug symmetric.    XII: Tongue midline.  Motor: Bulk, tone, and strength were normal throughout. No pronator drift. No tremor.   Upper extremities R/L Lower extremities R/L   Shoulder Abduction 5/5 Hip Flexion 5/5   Elbow Flexion 5/5 Knee Extension 5/5   Elbow Extension 5/5 Knee Flexion 5/5     Plantar Flexion 5/5     Dorsiflexion 5/5     Sensory: Sensation to light touch, temperature, vibration, pinprick, and proprioception were grossly intact except  In left knee and downward, where sensation was decreased. Romberg negative.  Coordination: Finger to nose intact.  Reflexes: 2+ in upper extremities. 1+ in RLE. No reflexes in LLE. Unable to elicit ankle jerk reflex bilaterally.   Downgoing toes bilaterally.  Gait: Orthotic gait and some mild Trendelenberg. Pt unable to perform Toe, heel, and tandem walking.   Previous Labs and Imaging:     04/24/2017 - ED visit  EKG: NS. HR 85. QTc 462     CT head: un-remarkable     No labs in 2017.   Last BMP, Mag, CBC in Sept 2017 showed results consistent with microcytic anemia.   Assessment and Plan:    Brianna Barnett is a 67 y.o. female with a PMHx notable for anxiety on amitriptyline and left knee replacement and bilateral ankle injuries who presents for evaluation of new memory and chronic gait issues after  being evaluated in the ED for what appears to be a mechanical fall not from seizure or cardiogenic syncope or stroke. Denies head injury but may she had a mild concussion. 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 On a simplified mental status exam, patient was able to spell WORLD backwards correctly and was able to complete more mental substraction with more gentle reassurance. Her subsequent MOCA score 25/30 was achieved with gentle and steady guidance. When concentrates intensely on the tasks, she performs much better. Her anxiety likely contributes to poor concentration. Her confusion and poor concentration can also be attributed to her high dose of amitriptyline (3x 50mg  nightly). Her gait issues appear chronic and have correlated in difficulty with subsequent surgeries/injuries to her L knee and ankles. She does not have dementia as this time but I encourage regular follow up to monitor her progress.   1. Recommend an amitriptyline taper by cutting down from 150 mg nightly to 100 nightly every 2 weeks, then replace by increasing sertraline dose by 10 mg daily, which has a better side effect profile. I will send this recommendation to her  PCP Dr. Rennis Harding.   2. Patient should follow up with PCP.   3. Labs: TSH, B12, CBC to evaluate for other reversible causes of confusion.   4. Follow up in May 2019  when patient returns from [place]. Brianna Barnett knows to call the clinic or send a message through MyChart with any questions in the interim    Brianna Barnett was seen and staffed with Dr. Army Fossa, MD.    Brianna Barnett   PGY1 Neurology  1:22 PM, 05/04/17     I saw and evaluated the patient. I agree with the resident's/fellow's findings and plan of care as documented above.    Army Fossa, MD

## 2017-05-11 ENCOUNTER — Ambulatory Visit: Payer: Medicare (Managed Care) | Attending: Primary Care | Admitting: Primary Care

## 2017-05-11 ENCOUNTER — Telehealth: Payer: Self-pay | Admitting: Neurology

## 2017-05-11 ENCOUNTER — Encounter: Payer: Self-pay | Admitting: Primary Care

## 2017-05-11 ENCOUNTER — Telehealth: Payer: Self-pay | Admitting: Primary Care

## 2017-05-11 VITALS — BP 151/84 | HR 89 | Resp 16 | Wt 198.0 lb

## 2017-05-11 DIAGNOSIS — L298 Other pruritus: Secondary | ICD-10-CM | POA: Insufficient documentation

## 2017-05-11 DIAGNOSIS — Z0000 Encounter for general adult medical examination without abnormal findings: Secondary | ICD-10-CM | POA: Insufficient documentation

## 2017-05-11 DIAGNOSIS — R519 Headache, unspecified: Secondary | ICD-10-CM | POA: Insufficient documentation

## 2017-05-11 DIAGNOSIS — N898 Other specified noninflammatory disorders of vagina: Secondary | ICD-10-CM

## 2017-05-11 DIAGNOSIS — R269 Unspecified abnormalities of gait and mobility: Secondary | ICD-10-CM | POA: Insufficient documentation

## 2017-05-11 DIAGNOSIS — R030 Elevated blood-pressure reading, without diagnosis of hypertension: Secondary | ICD-10-CM | POA: Insufficient documentation

## 2017-05-11 DIAGNOSIS — W19XXXD Unspecified fall, subsequent encounter: Secondary | ICD-10-CM | POA: Insufficient documentation

## 2017-05-11 LAB — VAGINITIS SCREEN: DNA PROBE: Vaginitis Screen:DNA Probe: 0

## 2017-05-11 NOTE — Telephone Encounter (Signed)
Tried calling neuro [phone], will try again later, stated they are closed

## 2017-05-11 NOTE — Progress Notes (Signed)
[facility]     HISTORY OF PRESENT ILLNESS     Brianna Barnett is a 66 y.o. female who presents for Gait Problem (ed f/u post fall from losing balance) and Vaginal Itching    Patient comes in for ER follow-up.  Was seen in the emergency room on April 24, 2017 after a fall.  Patient went in because she hit her head and and thought she may have had transient loss of consciousness.  Per ER note patient's daughter was walking in front of the patient and her depression fall and immediately turned around and saw her mother on the ground.  No shaking or changes in tone.  No focal weakness. on Exam patient was noted to have an unsteady wide-based gait.  Daughter reported that this was baseline for patient.  CT head showed no acute intracranial abnormality.  Minimal early small vessel ischemic changes.  EKG done in the emergency room showed normal sinus rhythm at 85 bpm.  Patient was seen by neurology on May 04, 2017 recommendations were to cut back and wean off amitriptyline recommendation was to increase her sertraline dose by 10 mg.However patient is on no medication other than the amitriptyline and omeprazole.  Will clarify with neurology regarding recommendations.  Patient also reports that she has been having some vaginal itching.  Has a mild vaginal discharge.  Has not been sexually active in years.  No recent use of antibiotics.  States that she can see some white spots and redness around the introitus.  Her daughter thought that she might have yeast.  Patient states that she has been monitoring her blood pressures at home and they have been pretty well controlled.  She has a friend who used to be an EMT and she has been helping her monitor the blood pressure.  Patient does not remember the blood pressures and does not have a lot but states that they were within normal limits.  Denies any vision problems or changes in baseline headache    ROS     Systemic symptoms: No fever.  Head symptoms: Chronic  headaches.  Eye symptoms: No vision problems.  Cardiovascular symptoms: No chest pain or discomfort.  Pulmonary symptoms: No dyspnea  and no cough.  Gastrointestinal symptoms: Normal appetite, no nausea, no vomiting, no abdominal pain, and no change in stool.  Genitourinary symptoms: No urinary symptoms.  Vaginal itching and discharge  Neurological symptoms: No dizziness  and no lightheadedness.  Psychological symptoms: No depression.  Skin symptoms: No rash.      MEDICATIONS     Current Outpatient Prescriptions   Medication Sig    omeprazole (PRILOSEC) 20 MG capsule Take 20 mg by mouth 2 times daily    amitriptyline (ELAVIL) 50 MG tablet Take 3 tabs at night    ibuprofen (ADVIL,MOTRIN) 200 MG tablet Take 1 tablet (200 mg total) by mouth 3 times daily as needed for Pain   Do not take with prescription strength ibuprofen       Patient's medications were reviewed and updated as appropriate in eRecord.      ALLERGIES     Cat dander; Sulfa antibiotics; and Coconut      Patient's allergies were reviewed and updated as appropriate in eRecord.    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 No  distress.   Pleasant obese white female in no distress   HENT:   Head: Normocephalic and atraumatic.   Right Ear: External ear normal.   Left Ear: External ear normal.   Eyes: Right eye exhibits no discharge. Left eye exhibits no discharge. No scleral icterus.   Neck: Neck supple.   Pulmonary/Chest: Effort normal.   Genitourinary: Vagina normal. Vulva exhibits erythema. Vulva exhibits no exudate, no lesion, no rash and no tenderness. Vagina exhibits normal mucosa, no exudate, no lesion and no rugosity.   Neurological: She is alert and oriented to person, place, and time.   Skin: Skin is warm and dry. She is not diaphoretic.   Psychiatric: Mood and affect normal.         RECENT LABS   No results found for this or any previous visit (from the past 336 hour(s)).         ASSESSMENT/PLAN     1. Fall, subsequent encounter  I reviewed ER notes labs and imaging.  I reviewed recent neurology notes.  Fall may have been triggered by amitriptyline and patient is being weaned off of that.  I recommended that patient decrease her amitriptyline dose to 100 mg at night for the next 2 weeks.  Wi'll clarify with neurology regarding further medication adjustments and call patient.    2. Gait abnormality  CT scan within normal limits.  MRI done last year was within normal limits.  Followed by neurology.  Appears to be at baseline.  Supportive care.  No further intervention at this time.  - Vaginitis screen: DNA probe (Vagina); Future  - Vaginitis screen: DNA probe (Vagina)    3. Vaginal itching  There does not seem to be much discharge on exam.  I did take a culture.  Further management based on results.  May need to see an ObGyn if culture is within normal limits and patient has persistent itching.  I asked her to use only cotton underwear and not to wash or scrub aggressively.  Recommend using hypoallergenic products.    4. Elevated blood pressure reading without diagnosis of hypertension  Recommend low-salt diet.  Recommend exercise  for 30 minutes most days of the week.  Recommend weight loss..  .  Goal blood pressure at 130/80.  H and will monitor her blood pressures 2-3 times a week and will bring in log for review at her next office visit      5. Laboratory examination ordered as part of a complete physical examination  Do labs as ordered  - CBC; Future  - Comprehensive metabolic panel; Future  - Lipid add Rfx to Drt LDL if Trig >400; Future  - Hemoglobin A1c; Future  - TSH; Future    Return in about 4 weeks (around 06/08/2017) for Follow-up.    --Patient instructed to call if symptoms are not improving or worsening  --Follow-up arranged    Signed: Hetty Ely, MD on 05/11/2017 at 2:11 PM

## 2017-05-11 NOTE — Telephone Encounter (Signed)
Called and spoke to Neuro, they will put a message into the Dr. And have him call back here to discuss

## 2017-05-11 NOTE — Telephone Encounter (Signed)
Dr.Ellis' office called an states patients office note states to increase sertraline by 10mg  but patient does not take this and they would like clarification.

## 2017-05-11 NOTE — Telephone Encounter (Signed)
See note from neuro last week. Recommendations are to increase sertraline to 10 mg at night. Pt states that she is not on sertraline . Please call neuro and see if they can clarify. Note was done by neuro resident and signed off by attending

## 2017-05-11 NOTE — Patient Instructions (Signed)
Decrease amitryptiline to 2 tabs at night

## 2017-05-13 ENCOUNTER — Other Ambulatory Visit: Payer: Self-pay | Admitting: Primary Care

## 2017-05-13 DIAGNOSIS — N898 Other specified noninflammatory disorders of vagina: Secondary | ICD-10-CM

## 2017-05-14 ENCOUNTER — Telehealth: Payer: Self-pay | Admitting: Primary Care

## 2017-05-14 NOTE — Telephone Encounter (Signed)
-----   Message from Hetty Ely, MD sent at 05/13/2017  5:24 PM EDT -----  Vag cx was negative. No infection. Should see OBGYN as discussed. See referral.

## 2017-05-14 NOTE — Telephone Encounter (Signed)
Pt notified and verbalized understanding.

## 2017-05-14 NOTE — Telephone Encounter (Signed)
-----   Message from Josephine Ellis, MD sent at 05/13/2017  5:24 PM EDT -----  Vag cx was negative. No infection. Should see OBGYN as discussed. See referral.

## 2017-06-08 ENCOUNTER — Telehealth: Payer: Self-pay | Admitting: Primary Care

## 2017-06-08 NOTE — Telephone Encounter (Signed)
Pt just started a new job and had to cancel her Monday 9/24 appointment RE: balance issues.  Pt is asking to be seen on a Thursday or Friday before the end of October.  Pt stated:  wont have tests done until Oct 18 and JE wanted Pt to have those before she saw her again.   when she leaves [place] for [employer] 6 months.  Pt would like nurse to call back regarding.

## 2017-06-08 NOTE — Telephone Encounter (Signed)
Pt is scheduled for 10/12

## 2017-06-11 ENCOUNTER — Ambulatory Visit: Payer: Medicare (Managed Care) | Admitting: Primary Care

## 2017-06-22 ENCOUNTER — Other Ambulatory Visit: Payer: Self-pay

## 2017-06-22 ENCOUNTER — Ambulatory Visit: Payer: Medicare (Managed Care) | Attending: Obstetrics and Gynecology | Admitting: Obstetrics and Gynecology

## 2017-06-22 ENCOUNTER — Encounter: Payer: Self-pay | Admitting: Obstetrics and Gynecology

## 2017-06-22 VITALS — BP 141/88 | HR 92 | Ht 61.0 in | Wt 195.3 lb

## 2017-06-22 DIAGNOSIS — L292 Pruritus vulvae: Secondary | ICD-10-CM | POA: Insufficient documentation

## 2017-06-22 DIAGNOSIS — R32 Unspecified urinary incontinence: Secondary | ICD-10-CM | POA: Insufficient documentation

## 2017-06-22 DIAGNOSIS — Z124 Encounter for screening for malignant neoplasm of cervix: Secondary | ICD-10-CM | POA: Insufficient documentation

## 2017-06-22 MED ORDER — NYSTATIN-TRIAMCINOLONE 100000-0.1 UNIT/GM-% EX CREA *I*
TOPICAL_CREAM | Freq: Two times a day (BID) | CUTANEOUS | 0 refills | Status: DC
Start: 2017-06-22 — End: 2017-06-22

## 2017-06-22 NOTE — Telephone Encounter (Signed)
Pharmacy called, mycolog is not covered by insurance.  nystatin and triamcinolone to be ordered separately.  Message to provider

## 2017-06-23 ENCOUNTER — Encounter: Payer: Self-pay | Admitting: Obstetrics and Gynecology

## 2017-06-23 DIAGNOSIS — R32 Unspecified urinary incontinence: Secondary | ICD-10-CM | POA: Insufficient documentation

## 2017-06-23 MED ORDER — NYSTATIN 100000 UNIT/GM EX CREA *I*
TOPICAL_CREAM | Freq: Two times a day (BID) | CUTANEOUS | 2 refills | Status: DC
Start: 2017-06-23 — End: 2018-03-04

## 2017-06-23 MED ORDER — TRIAMCINOLONE ACETONIDE 0.1 % EX CREA *I*
TOPICAL_CREAM | Freq: Two times a day (BID) | CUTANEOUS | 0 refills | Status: AC
Start: 2017-06-23 — End: 2017-06-30

## 2017-06-23 NOTE — H&P (Signed)
[facility]    Chief Complaint:     New Patient Visit (vulvovaginal itching)    Subjective:     Brianna Barnett is a 66 y.o. female G2P2002 who presents to the office as a consult for vulvovaginal itching and painful burning. She also reports a discharge/odor over this time as well.  Can tell the discharge because it is a dark yellow on the pad she wears for incontinence. Has been an issue for the past number of months. Not getting worse over time.  This issue bothers her enough to cause her to have to take showers twice a day due to feeling unclean and the odor.  No abnormal bleeding. Notes she has not been sexually active recently.  Had one encounter about a year ago but before that it had been atleast 10 years.       Other issues today: none.    Vulvar Intake Questionnaire reviewed.     Review of Systems   Constitutional: Positive for diaphoresis. Negative for malaise/fatigue and weight loss.   Respiratory: Negative for cough and shortness of breath.    Cardiovascular: Negative for chest pain, palpitations and leg swelling.   Gastrointestinal: Negative for abdominal pain, blood in stool, constipation and diarrhea.   Genitourinary: Negative for dysuria, frequency and urgency.   Musculoskeletal: Negative for back pain, falls, joint pain and myalgias.   Skin:        Breasts: Negative for lumps, tenderness, nipple discharge   Neurological: Negative for dizziness and headaches.   Psychiatric/Behavioral: Negative for depression.       ROS reviewed per [facility] patient history intake form. Relevant symptoms noted in HPI and ROS.    Patient's medications, allergies, past medical, surgical, social and family histories were recorded, reviewed and updated as appropriate.    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    Incontinence of urine     Other pulmonary insufficiency, not elsewhere classified  06/06/2016     Past Surgical History:   Procedure Laterality Date    ANKLE SURGERY  2013    Fracture on the right crush injury on the left    DENTAL SURGERY      esophageal dilatation      EYE SURGERY Right     for detached retina    FOOT SURGERY Left 2011    KNEE SURGERY Left     3 times    PR THORACOSCOPY W/THERA WEDGE RESEXN INITIAL UNILAT Right 06/16/2016    Procedure: THORACOSCOPY (VATS) Wedge;  Surgeon: Barrett Henle, MD;  Location: [facility] MAIN OR;  Service: Other    TONSILLECTOMY      TUBAL LIGATION      pp btl @ umbilicus     Family History   Problem Relation Age of Onset    Heart Disease Mother     GERD Mother     Arthritis Mother     COPD Mother     Alzheimer's disease Mother     Osteoporosis Mother     Bleeding prob Brother     Asthma Brother      Gynecology History:  Menstrual Status: Postmenopausal LMP @ 66yo  Hormone Replacement Therapy: Never Used   Age of Menarche: 8   Pap History: History of abnormal        Comment: 1x with biopsy a long time ago -> normals since  Sexually Transmitted Infection History: None     OB History   Gravida Para Term Preterm AB Living   SAB TAB Ectopic Multiple Live Births       2      # Outcome Date GA Lbr Len/2nd Weight Sex Delivery Anes PTL Lv   2 Term 1975    F Vag-Spont   LIV   1 Term 59    M Vag-Spont   LIV            Objective:     Vitals:    06/22/17 1420   BP: 141/88   Pulse: 92   Weight:    Height:        Physical Exam:  Constitutional: She is oriented to person, place, and time. She appears well-developed and well-nourished. No distress.   HEENT: normocephalic, atraumatic  Respiratory: Breathing unlabored  Abdominal: Soft, non-distended, no masses palpable. No tenderness, rebound or guarding. Normal liver/spleen.   Genitourinary:  Vulva - mild erythema/tenderness of external vulva at midline bilaterally. Normal pigmentation, no fissures, bilateral labial regression and agglutination present. No clitoral phimosis, no lesions.  Urethral  meatus - normal appearance with no discharge or growths present  Bladder - nontender to palpation  Vagina - atrophic mucosa, no abnormal discharge, no lesions  Cervix - no lesions, no abnormal discharge  SUB - nontender, no masses  Uterus - non tender, not enlarged  Adnexa - no masses, non tender  Anus/ Perineum -  normal perineum, no hemorrhoids  Lymphatic - no prominent lymph nodes in groin  Musculoskeletal: Normal range of motion. She exhibits no edema and no tenderness.   Neurological: She is alert and oriented to person, place, and time. She exhibits normal muscle tone. Coordination normal.   Skin: Skin is warm and dry. No rash noted. She is not diaphoretic. No erythema. No pallor.   Psychiatric: She has a normal mood and affect. Her behavior is normal. Judgment and thought content normal.     Wet prep:   pH - normal   Whiff - negative   Clue cells - negative   Yeast - negative   Hyphae - negative         Maturation index -    5% parabasal cells  55% intermediate cells  40% superficial cells     Assessment:     External vulvar itching/irritation     Plan:      - Reviewed history and findings from todays exam. Vagina without discharge or pain with exam. Wet prep and Rakoff stain negative.  Vulvar yeast culture sent. Given pad use and leakage of urine, suspect the vulvar irritation is secondary to pad rubbing/vulvar atrophy and dryness.  Rx for nystatin/triamcinolone given. Further treatment pending culture results. Encouraged barrier cream when not using Rx for prevention of further issues.    - Patient overdue for pap. Pap/HPV testing sent.   - Discussed referral to urogyn for evaluation of her incontinence as I believe the discharge/odor she is noticing is related to her leakage.  Also, not needing a pad will likely help her vulvar issues.  Patient accepts referral.  - Patient to call with any further issues, otherwise will plan to return for AGY and/or prn.    Carolin Coy, MD  [facility]

## 2017-06-26 LAB — FUNGUS CULTURE: Fungal Culture: 0

## 2017-06-27 LAB — GYN CYTOLOGY

## 2017-06-28 ENCOUNTER — Encounter: Payer: Self-pay | Admitting: Obstetrics and Gynecology

## 2017-06-29 ENCOUNTER — Encounter: Payer: Self-pay | Admitting: Primary Care

## 2017-06-29 ENCOUNTER — Ambulatory Visit: Payer: Medicare (Managed Care) | Attending: Primary Care | Admitting: Primary Care

## 2017-06-29 ENCOUNTER — Other Ambulatory Visit: Payer: Self-pay | Admitting: Gastroenterology

## 2017-06-29 VITALS — BP 140/80 | HR 84 | Resp 16 | Ht 61.0 in | Wt 197.0 lb

## 2017-06-29 DIAGNOSIS — R269 Unspecified abnormalities of gait and mobility: Secondary | ICD-10-CM

## 2017-06-29 DIAGNOSIS — G4700 Insomnia, unspecified: Secondary | ICD-10-CM

## 2017-06-29 DIAGNOSIS — N898 Other specified noninflammatory disorders of vagina: Secondary | ICD-10-CM

## 2017-06-29 LAB — HM HIV SCREENING OFFERED

## 2017-06-29 LAB — HM MAMMOGRAPHY

## 2017-06-29 NOTE — Progress Notes (Signed)
[facility]     HISTORY OF PRESENT ILLNESS     Brianna Barnett is a 66 y.o. female who presents for Follow-up (pt wants to discuss her medication, pt is leaving for [facility] for 6 months next week) and Other (no colonoscopy, has mammo today, no flu shot)    Patient comes in for routine follow-up.  No concerns.  She was last seen in August for an ER follow-up after presenting to the emergency room with a fall.  It was attribute it to the 150 mg of amitriptyline.  Patient was also noted to have vulvar itching without an infection was referred to [facility].  Patient states that the ObGyn told her that they believe the vaginal itching is likely related to urinary incontinence and they are referring her to a uro-gynecologist.  Patient is waiting to hear back.  Patient states that she would prefer to stay on the amitriptyline and not do the sertraline.  She has had no further episodes and will be leaving to [place] for 6 months.  She is requesting a refill of the medication when she is in [place].  She has not yet made an appointment for a colonoscopy but plans to do so.  Due for mammogram today.  Denies any chest pain or shortness of breath.      ROS     Systemic symptoms: No fever.  Head symptoms: No headache.  Eye symptoms: No vision problems.  Cardiovascular symptoms: No chest pain or discomfort.  Pulmonary symptoms: No dyspnea  and no cough.  Gastrointestinal symptoms: Normal appetite, no nausea, no vomiting, no abdominal pain, and no change in stool.  Genitourinary symptoms: No urinary symptoms.  Neurological symptoms: No dizziness  and no lightheadedness.  Psychological symptoms: No depression.  Skin symptoms: No rash.    MEDICATIONS     Current Outpatient Prescriptions   Medication Sig    triamcinolone (KENALOG) 0.1 % cream Apply topically 2 times daily    omeprazole (PRILOSEC) 20 MG capsule Take 20 mg by mouth 2 times daily    amitriptyline (ELAVIL) 50 MG tablet Take 3 tabs at night    nystatin  (MYCOSTATIN) 100000 UNIT/GM cream Apply topically 2 times daily       Patient's medications were reviewed and updated as appropriate in eRecord.      ALLERGIES     Cat dander; Sulfa antibiotics; and Coconut      Patient's allergies were reviewed and updated as appropriate in eRecord.    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 No distress.   Pleasant obese Caucasian female in no distress   HENT:   Head: Normocephalic and atraumatic.   Right Ear: External ear normal.   Left Ear: External ear normal.   Eyes: Right eye exhibits no discharge. Left eye exhibits no discharge. No scleral icterus.   Cardiovascular: Normal rate, regular rhythm and normal heart sounds.    No murmur heard.  Pulmonary/Chest: Effort normal and breath sounds normal. No respiratory distress. She has no wheezes. She has no rales. She exhibits no tenderness.   Skin: She is not diaphoretic.   Psychiatric: Affect normal.         RECENT LABS     Recent Results (from the past 336 hour(s))   GYN Cytology    Collection Time: 06/22/17  4:01 PM   Result Value  Ref Range    GYN Cytology       [id number]    Clinical Data  Source Name: CERVICAL PAP SMEAR, LIQUID BASED SCRN  # of Monolayers: 1  # of Slides: 1      Diagnostic or Screen: Screening     SPECIMEN ADEQUACY:  Satisfactory for Evaluation    GENERAL CATEGORIZATION:  Negative for intraepithelial lesion or malignancy.    By physician request this case has been sent for Human Papilloma Virus  testing.    This specimen has been analyzed with the aid of the ThinPrep Imaging  System.  Every slide is reviewed by a cytotechnologist and any detected  potential cellular abnormality is interpreted by a pathologist.                           RELATED LABORATORY RESULTS    Test Name               Collected D/T           Result  HPV TYPE 16             06/22/2017 16:01         NEG  HPV TYPE 18             06/22/2017 16:01         NEG  HPV OTHER HR            06/22/2017 16:01         NEG    Test Description: The sensitivity and specificity of this PCR assay for  HPV16, HPV18 and 12 OTHER HIGH RISK HPV types (31, 33, 35, 39, 45, 51,  52,  56, 58, 59, 66, and 68) are undefined for non-cervical specimens.      Note: Pap tests are processed, reviewed and reported at [facility], [address], [address], [place],  [place] [address]    Cytotechnologist:                      Electronic Signature:  [facility] LYNCH, BS,CT(ASCP)             [facility], BS,CT(ASCP)  Reported Date:  06/27/2017     Fungus Culture    Collection Time: 06/22/17  4:11 PM   Result Value Ref Range    Fungal Culture .    HM HIV SCREENING OFFERED    Collection Time: 06/29/17 12:00 AM   Result Value Ref Range    HM HIV SCREENING OFFERED Declined             ASSESSMENT/PLAN   1. Insomnia  Patient wishes to continue on the 150 mg of amitriptyline for now.  She fully understands that this could be sedating her and causing gait instability.  However she has had no symptoms recently and she had a one-time  episode in August so she is hesitant to wean off and try any other medications.  We'll attempt to wean again in the summertime.    2. Vaginal itching  Reviewed notes from [place].  Will give her information to contact the uro gynecologist that was recommended.    3. Gait abnormality  No more episodes.  Patient reassured.  Will continue on the amitriptyline at 150 mg for now.      Return in about 6 months (around 12/28/2017) for Yearly Check-up.    --Patient instructed to call if symptoms are not improving or worsening  --Follow-up arranged    Signed: Hetty Ely, MD on 06/29/2017 at 12:50 PM

## 2017-07-12 ENCOUNTER — Encounter: Payer: Self-pay | Admitting: Primary Care

## 2017-08-05 ENCOUNTER — Other Ambulatory Visit: Payer: Self-pay | Admitting: Primary Care

## 2017-08-05 DIAGNOSIS — F419 Anxiety disorder, unspecified: Secondary | ICD-10-CM

## 2017-08-06 NOTE — Telephone Encounter (Signed)
MM updated, next OV 01/21/18

## 2018-01-19 ENCOUNTER — Other Ambulatory Visit
Admission: RE | Admit: 2018-01-19 | Discharge: 2018-01-19 | Disposition: A | Discharge status: Home / Self Care | Payer: Medicare (Managed Care) | Source: Ambulatory Visit | Attending: Primary Care | Admitting: Primary Care

## 2018-01-19 DIAGNOSIS — R41840 Attention and concentration deficit: Secondary | ICD-10-CM | POA: Insufficient documentation

## 2018-01-19 DIAGNOSIS — Z0000 Encounter for general adult medical examination without abnormal findings: Secondary | ICD-10-CM | POA: Insufficient documentation

## 2018-01-19 LAB — COMPREHENSIVE METABOLIC PANEL
ALT: 20 U/L (ref 0–35)
AST: 27 U/L (ref 0–35)
Albumin: 3.9 g/dL (ref 3.5–5.2)
Alk Phos: 106 U/L — ABNORMAL HIGH (ref 35–105)
Anion Gap: 11 (ref 7–16)
Bilirubin,Total: <0.2 mg/dL (ref 0.0–1.2)
CO2: 29 mmol/L — ABNORMAL HIGH (ref 20–28)
Calcium: 8.9 mg/dL (ref 8.6–10.2)
Chloride: 103 mmol/L (ref 96–108)
Creatinine: 0.81 mg/dL (ref 0.51–0.95)
GFR,Black: 86 *
GFR,Caucasian: 75 *
Glucose: 88 mg/dL (ref 60–99)
Lab: 7 mg/dL (ref 6–20)
Potassium: 3.6 mmol/L (ref 3.3–5.1)
Sodium: 143 mmol/L (ref 133–145)
Total Protein: 6.4 g/dL (ref 6.3–7.7)

## 2018-01-19 LAB — CBC AND DIFFERENTIAL
Baso # K/uL: 0 10*3/uL (ref 0.0–0.1)
Basophil %: 0.5 %
Eos # K/uL: 0.2 10*3/uL (ref 0.0–0.4)
Eosinophil %: 2.7 %
Hematocrit: 37 % (ref 34–45)
Hemoglobin: 11.3 g/dL (ref 11.2–15.7)
IMM Granulocytes #: 0 10*3/uL
IMM Granulocytes: 0.2 %
Lymph # K/uL: 1.5 10*3/uL (ref 1.2–3.7)
Lymphocyte %: 25.7 %
MCH: 24 pg/cell — ABNORMAL LOW (ref 26–32)
MCHC: 31 g/dL — ABNORMAL LOW (ref 32–36)
MCV: 78 fL — ABNORMAL LOW (ref 79–95)
Mono # K/uL: 0.4 10*3/uL (ref 0.2–0.9)
Monocyte %: 6.3 %
Neut # K/uL: 3.8 10*3/uL (ref 1.6–6.1)
Nucl RBC # K/uL: 0 10*3/uL (ref 0.0–0.0)
Nucl RBC %: 0 /100 WBC (ref 0.0–0.2)
Platelets: 337 10*3/uL (ref 160–370)
RBC: 4.7 MIL/uL (ref 3.9–5.2)
RDW: 18.6 % — ABNORMAL HIGH (ref 11.7–14.4)
Seg Neut %: 64.6 %
WBC: 5.8 10*3/uL (ref 4.0–10.0)

## 2018-01-19 LAB — MULTIPLE ORDERING DOCS

## 2018-01-19 LAB — LIPID PANEL
Chol/HDL Ratio: 2.4
Cholesterol: 183 mg/dL
HDL: 76 mg/dL
LDL Calculated: 87 mg/dL
Non HDL Cholesterol: 107 mg/dL
Triglycerides: 101 mg/dL

## 2018-01-19 LAB — TSH: TSH: 4.71 u[IU]/mL — ABNORMAL HIGH (ref 0.27–4.20)

## 2018-01-21 ENCOUNTER — Encounter: Payer: Self-pay | Admitting: Primary Care

## 2018-01-21 ENCOUNTER — Ambulatory Visit: Payer: Medicare (Managed Care) | Attending: Primary Care | Admitting: Primary Care

## 2018-01-21 VITALS — BP 148/100 | HR 92 | Temp 98.4°F | Ht 61.0 in | Wt 193.0 lb

## 2018-01-21 DIAGNOSIS — R269 Unspecified abnormalities of gait and mobility: Secondary | ICD-10-CM

## 2018-01-21 DIAGNOSIS — K219 Gastro-esophageal reflux disease without esophagitis: Secondary | ICD-10-CM

## 2018-01-21 DIAGNOSIS — Z1211 Encounter for screening for malignant neoplasm of colon: Secondary | ICD-10-CM

## 2018-01-21 DIAGNOSIS — Z0000 Encounter for general adult medical examination without abnormal findings: Secondary | ICD-10-CM

## 2018-01-21 DIAGNOSIS — R7989 Other specified abnormal findings of blood chemistry: Secondary | ICD-10-CM

## 2018-01-21 DIAGNOSIS — R739 Hyperglycemia, unspecified: Secondary | ICD-10-CM

## 2018-01-21 DIAGNOSIS — I10 Essential (primary) hypertension: Secondary | ICD-10-CM

## 2018-01-21 DIAGNOSIS — R1310 Dysphagia, unspecified: Secondary | ICD-10-CM

## 2018-01-21 DIAGNOSIS — F329 Major depressive disorder, single episode, unspecified: Secondary | ICD-10-CM

## 2018-01-21 DIAGNOSIS — E785 Hyperlipidemia, unspecified: Secondary | ICD-10-CM

## 2018-01-21 DIAGNOSIS — G4700 Insomnia, unspecified: Secondary | ICD-10-CM

## 2018-01-21 DIAGNOSIS — F32A Depression, unspecified: Secondary | ICD-10-CM

## 2018-01-21 DIAGNOSIS — I050 Rheumatic mitral stenosis: Secondary | ICD-10-CM

## 2018-01-21 DIAGNOSIS — R002 Palpitations: Secondary | ICD-10-CM

## 2018-01-21 DIAGNOSIS — J439 Emphysema, unspecified: Secondary | ICD-10-CM

## 2018-01-21 DIAGNOSIS — F419 Anxiety disorder, unspecified: Secondary | ICD-10-CM

## 2018-01-21 LAB — HEMOGLOBIN A1C: Hemoglobin A1C: 5.5 %

## 2018-01-21 LAB — VITAMIN B12: Vitamin B12: 323 pg/mL (ref 232–1245)

## 2018-01-21 MED ORDER — LISINOPRIL 5 MG PO TABS *I*
5.0000 mg | ORAL_TABLET | Freq: Every day | ORAL | 1 refills | Status: DC
Start: 2018-01-21 — End: 2018-03-20

## 2018-01-21 MED ORDER — SERTRALINE HCL 50 MG PO TABS *I*
50.0000 mg | ORAL_TABLET | Freq: Every day | ORAL | 1 refills | Status: DC
Start: 2018-01-21 — End: 2018-03-20

## 2018-01-21 NOTE — Progress Notes (Signed)
Eye exam done beginning of this year at [facility] in [place]

## 2018-01-21 NOTE — Progress Notes (Signed)
Today we reviewed and updated Brianna Barnett smoking status, activities of daily living, depression screen, fall risk, medications and allergies.   I have counseled the patient in the above areas.     Subjective:     Chief Complaint: Brianna Barnett is a 67 y.o. 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 06/23/2016    Other pulmonary insufficiency, not elsewhere classified 06/06/2016    GERD (gastroesophageal reflux disease)     Arthritis      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    Incontinence of urine     Other pulmonary insufficiency, not elsewhere classified 06/06/2016     Past Surgical History:   Procedure Laterality Date    ANKLE SURGERY  2013    Fracture on the right crush injury on the left    DENTAL SURGERY      esophageal dilatation      EYE SURGERY Right     for detached retina    FOOT SURGERY Left 2011    KNEE SURGERY Left     3 times    PR THORACOSCOPY W/THERA WEDGE RESEXN INITIAL UNILAT Right 06/16/2016    Procedure: THORACOSCOPY (VATS) Wedge;  Surgeon: Barrett Henle, MD;  Location: [facility] MAIN OR;  Service: Other    TONSILLECTOMY      TUBAL LIGATION      pp btl @ umbilicus     Family History   Problem Relation Age of Onset    Heart Disease Mother     GERD  Mother     Arthritis Mother     COPD Mother     Alzheimer's disease Mother     Osteoporosis Mother     Bleeding prob Brother     Asthma Brother      Social History     Social History    Marital status: Divorced     Spouse name: N/A    Number of children: N/A    Years of education: N/A     Occupational History    Not on file.     Social History Main Topics    Smoking status: Former Smoker     Packs/day: 0.75     Years: 37.00     Types: Cigarettes     Quit date: 09/19/1999    Smokeless tobacco: Never Used    Alcohol use Yes      Comment: 1 or less a month    Drug use: No    Sexual activity: No     Social History Narrative    No narrative on file       Objective:  Vital Signs: BP (!) 148/100 (BP Location: Left arm, Patient Position: Sitting, Cuff Size: adult)    Pulse 92    Temp 36.9 C (98.4 F) (Tympanic)    Ht 1.549 m ( )    Wt 87.5 kg (193 lb)    SpO2 98%    BMI 36.47 kg/m    BMI: Body mass index is 36.47 kg/m.    Vision Screening Results (Welcome visit only):  No exam data present    Assessment and Plan:      Cognitive Function:  Recall of recent and remote events appears:  Normal    The following health maintenance plan was reviewed with the patient:    Health Maintenance   Topic Date Due    DEPRESSION SCREEN MONTHLY  05-Sep-1951    COLON CANCER SCREENING 10 YEAR COLONOSCOPY  11/21/1984    IMM-ZOSTER (2 of 3) 05/03/2015    IMM-PNEUMOVAX VACCINE 65 + YRS (1) 03/07/2016    IMM-INFLUENZA (Season Ended) 05/19/2018    Fall Risk Screening  06/22/2018    Breast Cancer Screening Other  06/29/2018    HIV TESTING OFFERED  Completed    IMM-PREVNAR VACCINE 65 + YRS  Completed    HEPATITIS C SCREENING OFFERED  Completed     This health maintenance schedule, identified risks, a list of orders placed today and patient goals have been provided to [employer] in the after visit summary.

## 2018-01-21 NOTE — Progress Notes (Signed)
[facility]  Progress Note    Reason For Visit   Adult Comprehensive Physical Exam.    HPI   Today has concerns of:  BP continues to be high. Elevated with multiple reads. Denies any headache or vision problems  Still having trouble swallowing. Had an EGD done in 2017 and also had her esophagus stretched then    The Problem List, MHx, SHx, FHx, Immunizations, Medications, and Allergies were reviewed with the patient today during the appointment and updates were made today.    Allergies   Allergen Reactions    Cat Dander Shortness Of Breath and Itching    Sulfa Antibiotics Hives, Itching and Nausea And Vomiting    Coconut Other (See Comments)     Reaction not specified       Current Outpatient Prescriptions   Medication Sig Dispense Refill    meloxicam (MOBIC) 15 MG tablet Take 15 mg by mouth      nystatin (MYCOSTATIN) 100000 UNIT/GM cream Apply topically 2 times daily 15 g 2    omeprazole (PRILOSEC) 20 MG capsule Take 20 mg by mouth daily         tiotropium (SPIRIVA) 18 MCG inhalation capsule Inhale 18 mcg into the lungs      albuterol HFA 108 (90 Base) MCG/ACT inhaler Inhale 2 puffs into the lungs      lisinopril (PRINIVIL,ZESTRIL) 5 MG tablet Take 1 tablet (5 mg total) by mouth daily 30 tablet 1    sertraline (ZOLOFT) 50 MG tablet Take 1 tablet (50 mg total) by mouth daily 30 tablet 1     No current facility-administered medications for this visit.      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 06/23/2016    Other pulmonary insufficiency, not elsewhere classified 06/06/2016    GERD (gastroesophageal reflux  disease)     Arthritis      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    Incontinence of urine     Other pulmonary insufficiency, not elsewhere classified 06/06/2016     Past Surgical History:   Procedure Laterality Date    ANKLE SURGERY  2013    Fracture on the right crush injury on the left    DENTAL SURGERY      esophageal dilatation      EYE SURGERY Right     for detached retina    FOOT SURGERY Left 2011    KNEE SURGERY Left     3 times    PR THORACOSCOPY W/THERA WEDGE RESEXN INITIAL UNILAT Right 06/16/2016    Procedure: THORACOSCOPY (VATS) Wedge;  Surgeon: Larene Beach, MD;  Location: [facility] MAIN OR;  Service: Other    TONSILLECTOMY      TUBAL LIGATION      pp btl @ umbilicus     Family History   Problem Relation Age of Onset    Heart Disease Mother     GERD Mother  Arthritis Mother     COPD Mother     Alzheimer's disease Mother     Osteoporosis Mother     No Known Problems Brother      Social History     Social History    Marital status: Divorced     Spouse name: N/A    Number of children: N/A    Years of education: N/A     Social History Main Topics    Smoking status: Former Smoker     Packs/day: 0.75     Years: 37.00     Types: Cigarettes     Quit date: 09/19/1999    Smokeless tobacco: Never Used    Alcohol use Yes      Comment: 1 or less a month    Drug use: No    Sexual activity: No     Other Topics Concern    None     Social History Narrative    None          ROS    Systemic symptoms: Not feeling tired (fatigue).  No fever, no chills, and no recent weight change.  Head symptoms: No headache.  Eye symptoms: No vision problems.  Otolaryngeal symptoms: No hearing loss, no earache, and no tinnitus.  No nasal symptoms  and no sore throat.  Cardiovascular symptoms: No chest pain or discomfort, no palpitations, and no intermittent leg claudication.  Pulmonary symptoms: No dyspnea, no  cough, and no wheezing.  Gastrointestinal symptoms: Normal appetite, no heartburn, no nausea, no vomiting, no abdominal pain, no change in stool, no diarrhea, and no constipation.  Genitourinary symptoms: No hematuria  and no increase in urinary frequency.  No dysuria.  Hematologic symptoms: No easy bleeding  and no tendency for easy bruising.  Musculoskeletal symptoms: No back pain  and no myalgias.  No localized joint pain  and no localized joint swelling.  Neurological symptoms: No dizziness, no lightheadedness, no fainting, no memory lapses or loss, no motor disturbances, and no sensory disturbances.  Psychological symptoms: No anxiety  and no depression.  Skin symptoms: No pruritus  and no rash.    Vitals  Blood pressure (!) 148/100, pulse 92, temperature 36.9 C (98.4 F), temperature source Tympanic, height 1.549 m (_0 ), weight 87.5 kg (193 lb), SpO2 98 %.    Physical Exam   Constitutional: She is oriented to person, place, and time and well-developed, well-nourished, and in no distress. No distress.   HENT:   Head: Normocephalic and atraumatic.   Right Ear: External ear normal.   Left Ear: External ear normal.   Nose: Nose normal.   Mouth/Throat: Oropharynx is clear and moist. No oropharyngeal exudate.   Eyes: Pupils are equal, round, and reactive to light. Right eye exhibits no discharge. Left eye exhibits no discharge. No scleral icterus.   Neck: Normal range of motion. Neck supple. No JVD present. No thyromegaly present.   Cardiovascular: Normal rate, regular rhythm and normal heart sounds.  Exam reveals no gallop.    No murmur heard.  Pulmonary/Chest: Effort normal and breath sounds normal. No respiratory distress. She has no wheezes. She has no rales. She exhibits no tenderness.   Abdominal: Soft. Bowel sounds are normal. She exhibits no distension and no mass. There is no tenderness. There is no rebound and no guarding.   Musculoskeletal: Normal range of motion.   Lymphadenopathy:     She has no  cervical adenopathy.   Neurological: She is alert and oriented to person,  place, and time. No cranial nerve deficit.   Skin: Skin is warm and dry. She is not diaphoretic.   Psychiatric: Mood and affect normal.       Lab Results  Lab Results   Component Value Date    WBC 5.8 01/19/2018    HGB 11.3 01/19/2018    HCT 37 01/19/2018    MCV 78 (L) 01/19/2018    PLT 337 01/19/2018       Chemistry        Lab results: 01/19/18  0950   Sodium 143   Potassium 3.6   Chloride 103   CO2 29*   GFR,Caucasian 75   GFR,Black 86   UN 7   Creatinine 0.81        Lab results: 01/19/18  0950   Glucose 88   Calcium 8.9   Total Protein 6.4   Albumin 3.9   ALT 20   AST 27   Alk Phos 106*   Bilirubin,Total <0.2          Lab Results   Component Value Date    CHOL 183 01/19/2018    HDL 76 01/19/2018    LDLC 87 01/19/2018    TRIG 101 01/19/2018    CHHDC 2.4 01/19/2018     Lab Results   Component Value Date    TSH 4.71 (H) 01/19/2018     Hemoglobin A1C   Date Value Ref Range Status   01/19/2018 5.5 % Final     Comment:     Ref Range <=5.6  HbA1c values of 5.7-6.4% indicate an increased risk for developing  diabetes mellitus.  HbA1c values greater than or equal to 6.5% are diagnostic of  diabetes mellitus.  For diagnosis of diabetes in individuals without unequivocal  hyperglycemia, results should be confirmed by repeat testing.       EKG done today shows normal sinus rhythm at 89 bpm.  Left axis deviation noted.  No ST-T changes.    Assessment   This is a 67 y.o.female who comes in today for a physical exam    1. Preventative health care  Reviewed healthy lifestyle measures. Up to date on routine preventive care. Up to date on immunisations. Reviewed current guidelines for diet, exercise and sleep    2. Insomnia, unspecified type  Wean off amitriptyline. Will be on zoloft    3. Gait abnormality  No recent changes. Will see neuro as scheduled.     4. Elevated blood pressure reading without diagnosis of hypertension  Recommend low-salt diet.   Recommend exercise for 30 minutes most days of the week or as much as possible. Start on lisinopril. Risks,benefits,side effects reviewed with patient. Work on weight loss.  .  Goal blood pressure at 130/80.  Do blood work prior to next office visit in 6 months.    5. Hyperglycemia  Recommend low carb diet.  Maintain a healthy weight.  Exercise 30 minutes most days of the week.  Check labs as ordered.    6. Hyperlipidemia, unspecified hyperlipidemia type  Recommend low cholesterol diet.  Exercise as much as possible.   Check labs as ordered.    7. Mitral valve stenosis, unspecified etiology  Managed by cardiology. Has f/u in a few months    8. 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 Continue to observe    9. Gastroesophageal reflux disease without esophagitis  Avoid fried, fatty or spicy or acidic foods.  Avoid Caffeinated and carbonated drinks.  Call if no better or with any worsening.  Don't eat late at night.  Don't eat 3 hours prior to bedtime and drink all liquids 2 hours prior to bedtime.  Take medication as directed.  Avoid NSAIDs.  Sleep with the head end of the bed up 30.continue omeprazole daily. See GI as discussed.     10. Anxiety and depression  Start on zoloft. Risks benefits side effects reviewed with patient. Reassess in 4-6 weeks or sooner as needed. Stay active physically. Not suicidal. Has good social support        Follow up: Return in about 6 weeks (around 03/04/2018) for depression, anxiety.    Author: Earna Coder, MD  Note created: 01/21/2018  at: 11:02 PM

## 2018-01-21 NOTE — Patient Instructions (Signed)
Thank you for completing your Subsequent Annual Medicare Visit; Lab Results; and Annual Exam   with [place] today.     The purpose of this visits was to:     Screen for disease   Assess risk of future medical problems   Help develop a healthy lifestyle   Update vaccines   Get to know your doctor in case of an illness    Patient Care Team:  Hetty Ely, MD as PCP - Corinna Capra, MD       The following items were identified as areas of concern during your screening today:  BMI greater than 25 - This is a risk for Heart Attack, Stroke, High Blood Pressure, Diabetes, High Cholesterol and other complications.       The Health Maintenance table below identifies screening tests and immunizations recommended by your health care team:  Health Maintenance   Topic Date Due    DEPRESSION SCREEN MONTHLY  [birth year]/05/49    COLON CANCER SCREENING 10 YEAR COLONOSCOPY  11/21/1984    IMM-ZOSTER (2 of 3) 05/03/2015    IMM-PNEUMOVAX VACCINE 65 + YRS (1) 03/07/2016    IMM-INFLUENZA (Season Ended) 05/19/2018    Fall Risk Screening  06/22/2018    Breast Cancer Screening Other  06/29/2018    HIV TESTING OFFERED  Completed    IMM-PREVNAR VACCINE 65 + YRS  Completed    HEPATITIS C SCREENING OFFERED  Completed     In addition, goals and orders placed to address these recommendations are listed above.    We wish you the best of health and look forward to seeing you again next year for your Annual Medicare Wellness Visit.     If you have any health care concerns before then, please do not hesitate to contact us.

## 2018-01-25 ENCOUNTER — Telehealth: Payer: Self-pay | Admitting: Student in an Organized Health Care Education/Training Program

## 2018-01-25 NOTE — Telephone Encounter (Signed)
Left VM for patient. TSH level was high. I do not know her current symptoms as I last saw her in August. I encouraged her to call PCP to see if she would need additional thyroid level testing.

## 2018-03-04 ENCOUNTER — Ambulatory Visit: Payer: Medicare (Managed Care) | Attending: Primary Care | Admitting: Primary Care

## 2018-03-04 ENCOUNTER — Encounter: Payer: Self-pay | Admitting: Primary Care

## 2018-03-04 ENCOUNTER — Other Ambulatory Visit
Admission: RE | Admit: 2018-03-04 | Discharge: 2018-03-04 | Disposition: A | Discharge status: Home / Self Care | Payer: Medicare (Managed Care) | Source: Ambulatory Visit | Attending: Primary Care | Admitting: Primary Care

## 2018-03-04 ENCOUNTER — Telehealth: Payer: Self-pay | Admitting: Primary Care

## 2018-03-04 VITALS — BP 118/66 | HR 92 | Ht 61.0 in | Wt 190.0 lb

## 2018-03-04 DIAGNOSIS — R059 Cough, unspecified: Secondary | ICD-10-CM

## 2018-03-04 DIAGNOSIS — R05 Cough: Secondary | ICD-10-CM

## 2018-03-04 DIAGNOSIS — F32A Depression, unspecified: Secondary | ICD-10-CM

## 2018-03-04 DIAGNOSIS — F329 Major depressive disorder, single episode, unspecified: Secondary | ICD-10-CM

## 2018-03-04 DIAGNOSIS — R7989 Other specified abnormal findings of blood chemistry: Secondary | ICD-10-CM

## 2018-03-04 DIAGNOSIS — F419 Anxiety disorder, unspecified: Secondary | ICD-10-CM

## 2018-03-04 DIAGNOSIS — R946 Abnormal results of thyroid function studies: Secondary | ICD-10-CM | POA: Insufficient documentation

## 2018-03-04 DIAGNOSIS — K219 Gastro-esophageal reflux disease without esophagitis: Secondary | ICD-10-CM

## 2018-03-04 DIAGNOSIS — M1712 Unilateral primary osteoarthritis, left knee: Secondary | ICD-10-CM

## 2018-03-04 LAB — T4, FREE: Free T4: 0.9 ng/dL (ref 0.9–1.7)

## 2018-03-04 LAB — TSH: TSH: 3.32 u[IU]/mL (ref 0.27–4.20)

## 2018-03-04 MED ORDER — FLUTICASONE PROPIONATE HFA 220 MCG/ACT IN AERO *I*
1.0000 | INHALATION_SPRAY | Freq: Two times a day (BID) | RESPIRATORY_TRACT | 1 refills | Status: DC
Start: 2018-03-04 — End: 2018-04-09

## 2018-03-04 MED ORDER — PREDNISONE 10 MG PO TABS *I*
ORAL_TABLET | ORAL | 0 refills | Status: AC
Start: 2018-03-04 — End: 2018-03-12

## 2018-03-04 NOTE — Telephone Encounter (Signed)
Left detailed Vm letting pt know that prednisone was called in to replace the inhaler that wasn't covered to call us with any concerns and keep the follow up appt

## 2018-03-04 NOTE — Telephone Encounter (Signed)
Pharmacy called to let us know that the FLOVENT that was sent in is very expensive for pt they are asking for a different script to see if they can get something cheaper for pt through her insurance

## 2018-03-04 NOTE — Telephone Encounter (Signed)
I Sent in  A script for oral prednisone since the inhaler is not covered. She should take with food. Keep f/u as scheduled. Call with any concerns

## 2018-03-04 NOTE — Progress Notes (Signed)
Follow up.

## 2018-03-04 NOTE — Progress Notes (Signed)
[facility]     HISTORY OF PRESENT ILLNESS     Brianna Barnett is a 67 y.o. female who presents for Thyroid Problem; Depression; and Hypertension    Patient comes in for follow-up of the following medical problems  , 1 anxiety and depression-appears to be well controlled on current dose of Zoloft  #2 osteoarthritis of the left knee-patient was seen by orthopedics and was recommended meloxicam.  She takes it when necessary  #3 GERD-takes omeprazole daily    Was on vacation in [place] for 3 weeks from late May to early June and got very sick and was seen in the urgent care there and treated for bronchitis.  Patient states that she was treated with an antibiotic and given Tessalon Perles to take as needed.  She is much better but still continues to cough a lot.  Cough is both day and night and is nonproductive.  Denies any wheezing or shortness of breath.  No x-ray was done at the urgent care in [place]    Patient is due to have her TSH rechecked after her last visit but has not yet had that done.  States that she wasn't aware she needed to have blood work done      ROS     Systemic symptoms-positive for weight loss  Respiratory symptoms-positive for cough, negative for shortness of breath and wheezing  Cardiac symptoms-no reported chest pain or palpitations    MEDICATIONS     Current Outpatient Prescriptions   Medication Sig    benzonatate (TESSALON) 200 MG capsule Take 200 mg by mouth 3 times daily as needed for Cough    meloxicam (MOBIC) 15 MG tablet Take 15 mg by mouth    albuterol HFA 108 (90 Base) MCG/ACT inhaler Inhale 2 puffs into the lungs    lisinopril (PRINIVIL,ZESTRIL) 5 MG tablet Take 1 tablet (5 mg total) by mouth daily    sertraline (ZOLOFT) 50 MG tablet Take 1 tablet (50 mg total) by mouth daily    omeprazole (PRILOSEC) 20 MG capsule Take 20 mg by mouth daily       fluticasone (FLOVENT HFA) 220 MCG/ACT inhaler Inhale 1 puff into the lungs 2 times daily   Shake well before each use.        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    Cramps of left lower extremity R25.2    Hyperglycemia R73.9    Hyperlipidemia E78.5    Insomnia G47.00    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    Mitral valve stenosis I05.0    Headache R51    Elevated blood pressure reading without diagnosis of hypertension R03.0    Incontinence of urine R32         TOBACCO USE HISTORY     History   Smoking Status    Former Smoker    Packs/day: 0.75    Years: 37.00    Types: Cigarettes  Quit date: 09/19/1999   Smokeless Tobacco    Never Used       PHYSICAL EXAM     BP 118/66 (BP Location: Right arm, Patient Position: Sitting, Cuff Size: adult)    Pulse 92    Ht 1.549 m (5\' 1" )    Wt 86.2 kg (190 lb)    SpO2 96%    BMI 35.90 kg/m     General Appearance: No distress    Physical Exam   Constitutional: She is well-developed, well-nourished, and in no distress. No distress.   HENT:   Head: Normocephalic and atraumatic.   Right Ear: External ear normal.   Left Ear: External ear normal.   Nose: Nose normal.   Mouth/Throat: No oropharyngeal exudate.   Eyes: Right eye exhibits no discharge. Left eye exhibits no discharge.   Cardiovascular: Normal rate, regular rhythm and normal heart sounds.    No murmur heard.  Pulmonary/Chest: Effort normal and breath sounds normal. No respiratory distress. She has no wheezes. She has no rales.   Skin: She is not diaphoretic.   Psychiatric: Affect normal.         RECENT LABS   No results found for this or any  previous visit (from the past 336 hour(s)).         ASSESSMENT/PLAN     1. Anxiety and depression  Continue current dose of Zoloft.  Appears to be well controlled.  Has good social support.  Not suicidal.  Stay active.    2. Primary osteoarthritis of left knee  Reviewed notes from orthopedics.  We reviewed risks and benefits from meloxicam.  Patient understands increased risk for heart attack and stroke but only takes the meloxicam as needed.    3. Gastroesophageal reflux disease without esophagitis  Avoid fried, fatty or spicy or acidic foods.  Avoid Caffeinated and carbonated drinks.  Call if no better or with any worsening.  Don't eat late at night.  Don't eat 3 hours prior to bedtime and drink all liquids 2 hours prior to bedtime.  Take medication as directed.  Avoid NSAIDs.  Sleep with the head end of the bed up 30.  Will need to discuss EGD evaluation at next office visit.    4. Cough  Will review notes from [place] when available.  Spirometry shows FEV1 at 54% predicted and moderate obstruction.  Recommend starting on Flovent.  Risks benefits side effects reviewed with patient.  Reassess at next office visit in 4-6 weeks.  I suspect that the  cough is from bronchospasm.    5. Abnormal TSH  Recheck levels as ordered.    Return in about 6 weeks (around 04/15/2018) for Follow-up.    --Patient instructed to call if symptoms are not improving or worsening  --Follow-up arranged    Signed: Hetty Ely, MD on 03/04/2018 at 12:00 PM

## 2018-03-04 NOTE — Telephone Encounter (Signed)
Please call pharmacy to see if they have any recommendations for what will be covered by her insurance

## 2018-03-04 NOTE — Telephone Encounter (Signed)
Spoke to pharmacist and they ran the asmanex , Pulmicort and the Q- var inhalers They were all more expensive so the Flovent is the cheapiest Please advise

## 2018-03-04 NOTE — Telephone Encounter (Signed)
Will forward to Dr Ellis to advise and order an alternative

## 2018-03-20 ENCOUNTER — Other Ambulatory Visit: Payer: Self-pay | Admitting: Primary Care

## 2018-03-20 NOTE — Telephone Encounter (Signed)
03-04-18    04-09-18

## 2018-04-09 ENCOUNTER — Ambulatory Visit
Admission: RE | Admit: 2018-04-09 | Discharge: 2018-04-09 | Disposition: A | Discharge status: Home / Self Care | Payer: Medicare (Managed Care) | Source: Ambulatory Visit

## 2018-04-09 ENCOUNTER — Ambulatory Visit: Payer: Medicare (Managed Care) | Attending: Primary Care | Admitting: Primary Care

## 2018-04-09 ENCOUNTER — Encounter: Payer: Self-pay | Admitting: Primary Care

## 2018-04-09 VITALS — BP 134/88 | HR 104 | Resp 16 | Wt 190.0 lb

## 2018-04-09 DIAGNOSIS — R05 Cough: Secondary | ICD-10-CM

## 2018-04-09 DIAGNOSIS — R059 Cough, unspecified: Secondary | ICD-10-CM

## 2018-04-09 DIAGNOSIS — R7989 Other specified abnormal findings of blood chemistry: Secondary | ICD-10-CM

## 2018-04-09 MED ORDER — BENZONATATE 200 MG PO CAPS *I*
200.0000 mg | ORAL_CAPSULE | Freq: Three times a day (TID) | ORAL | 0 refills | Status: DC | PRN
Start: 2018-04-09 — End: 2019-01-27

## 2018-04-09 MED ORDER — MELOXICAM 15 MG PO TABS *I*
15.0000 mg | ORAL_TABLET | Freq: Every day | ORAL | 5 refills | Status: DC
Start: 2018-04-09 — End: 2018-06-21

## 2018-04-09 MED ORDER — FLUTICASONE PROPIONATE HFA 220 MCG/ACT IN AERO *I*
1.0000 | INHALATION_SPRAY | Freq: Two times a day (BID) | RESPIRATORY_TRACT | 5 refills | Status: DC
Start: 2018-04-09 — End: 2018-05-30

## 2018-04-09 NOTE — Progress Notes (Signed)
[facility]     HISTORY OF PRESENT ILLNESS     Brianna Barnett is a 67 y.o. female who presents for Follow-up (cough and bloodwork)    Comes in for f/u of cough. Was started on flovent at her last OV. Cough is better but still coughing at night and needing to take benzonatate prior to bed.  TSH was abn at last blood draw and was repeated . It was normal    ROS  resp symptoms- cough  Cardiac symptoms- no reported chest pain or palpitations  Skin symptoms- no new hair loss  MEDICATIONS     Current Outpatient Prescriptions   Medication Sig    benzonatate (TESSALON) 200 MG capsule Take 1 capsule (200 mg total) by mouth 3 times daily as needed for Cough    meloxicam (MOBIC) 15 MG tablet Take 1 tablet (15 mg total) by mouth daily    fluticasone (FLOVENT HFA) 220 MCG/ACT inhaler Inhale 1 puff into the lungs 2 times daily   Shake well before each use.    sertraline (ZOLOFT) 50 MG tablet TAKE 1 TABLET BY MOUTH EVERY DAY    lisinopril (PRINIVIL,ZESTRIL) 5 MG tablet TAKE 1 TABLET BY MOUTH EVERY DAY    albuterol HFA 108 (90 Base) MCG/ACT inhaler Inhale 2 puffs into the lungs    omeprazole (PRILOSEC) 20 MG capsule Take 20 mg by mouth daily          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    Cramps of left lower extremity R25.2    Hyperglycemia R73.9    Hyperlipidemia E78.5    Insomnia G47.00    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    Mitral valve stenosis I05.0    Headache R51    Elevated blood pressure reading  without diagnosis of hypertension R03.0    Incontinence of urine R32         TOBACCO USE HISTORY     History   Smoking Status    Former Smoker    Packs/day: 0.75    Years: 37.00    Types: Cigarettes    Quit date: 09/19/1999   Smokeless Tobacco    Never Used       PHYSICAL EXAM     BP 134/88    Pulse 104    Resp 16    Wt 86.2 kg (190 lb)    SpO2 97%    BMI 35.90 kg/m     Physical Exam   Constitutional: She is well-developed, well-nourished, and in no distress.   HENT:   Head: Normocephalic and atraumatic.   Right Ear: External ear normal.   Left Ear: External ear normal.   Nose: Nose normal.   Eyes: Right eye exhibits no discharge. Left eye exhibits no discharge.   Cardiovascular: Normal rate, regular rhythm and normal heart sounds.    No murmur heard.  Pulmonary/Chest: Effort normal and breath sounds normal. No respiratory distress.  Psychiatric: Affect normal.         RECENT LABS   Spirometry done today shows FEV1 at 63% predicted as well as moderate obstruction.        ASSESSMENT/PLAN     1. Cough    Do chest xray as ordered. Continue flovent at current dose. No significant change in spirometry.  Use benzonatate as needed. Reassess in 3 months or sooner as needed.  - Spirometry (In Office); Future  - *Chest standard frontal and lateral views; Future    2. Abnormal TSH    TSh is wnl. Pt reassured.     Return in about 3 months (around 07/10/2018), or if symptoms worsen or fail to improve, for Follow-up.    --Patient instructed to call if symptoms are not improving or worsening  --Follow-up arranged    Signed: Hetty Ely, MD on 04/09/2018 at 9:38 PM

## 2018-04-09 NOTE — Progress Notes (Signed)
Patient states the cough continues to be persistent    Patient is asking for another course of Bezonontate as it's the only thing that helps the cough    Patient states Dr. Mercie EonLoveys would like PCP to take over prescribing her Meloxicam

## 2018-04-10 ENCOUNTER — Telehealth: Payer: Self-pay

## 2018-04-10 NOTE — Telephone Encounter (Signed)
-----   Message from Hetty ElyJosephine Ellis, MD sent at 04/09/2018  7:54 PM EDT -----  Chest xray was normal. Please notify pt

## 2018-04-10 NOTE — Telephone Encounter (Signed)
Left detailed message with normal cxr result  On patient's personal voicemail

## 2018-05-16 ENCOUNTER — Telehealth: Payer: Self-pay | Admitting: Primary Care

## 2018-05-16 NOTE — Telephone Encounter (Signed)
[facility]  to  Reschedule follow up appointment with Dr. Rennis HardingEllis for 9/25 as Dr. Rennis HardingEllis has had a family emergency and will be out of town.    [phone] Thank you

## 2018-05-17 NOTE — Telephone Encounter (Signed)
Call back from [place] who rescheduled 9/12

## 2018-05-30 ENCOUNTER — Ambulatory Visit
Admission: RE | Admit: 2018-05-30 | Discharge: 2018-05-30 | Disposition: A | Discharge status: Home / Self Care | Payer: Medicare (Managed Care) | Source: Ambulatory Visit | Attending: Primary Care | Admitting: Primary Care

## 2018-05-30 ENCOUNTER — Ambulatory Visit: Payer: Medicare (Managed Care) | Admitting: Primary Care

## 2018-05-30 ENCOUNTER — Encounter: Payer: Self-pay | Admitting: Primary Care

## 2018-05-30 VITALS — BP 136/86 | HR 87 | Temp 97.8°F | Ht 61.0 in | Wt 184.0 lb

## 2018-05-30 DIAGNOSIS — R05 Cough: Secondary | ICD-10-CM

## 2018-05-30 DIAGNOSIS — J430 Unilateral pulmonary emphysema [MacLeod's syndrome]: Secondary | ICD-10-CM | POA: Insufficient documentation

## 2018-05-30 DIAGNOSIS — F329 Major depressive disorder, single episode, unspecified: Secondary | ICD-10-CM

## 2018-05-30 DIAGNOSIS — F419 Anxiety disorder, unspecified: Secondary | ICD-10-CM | POA: Insufficient documentation

## 2018-05-30 DIAGNOSIS — R0982 Postnasal drip: Secondary | ICD-10-CM | POA: Insufficient documentation

## 2018-05-30 DIAGNOSIS — R059 Cough, unspecified: Secondary | ICD-10-CM

## 2018-05-30 DIAGNOSIS — R911 Solitary pulmonary nodule: Secondary | ICD-10-CM | POA: Insufficient documentation

## 2018-05-30 DIAGNOSIS — Z9889 Other specified postprocedural states: Secondary | ICD-10-CM | POA: Insufficient documentation

## 2018-05-30 DIAGNOSIS — R634 Abnormal weight loss: Secondary | ICD-10-CM

## 2018-05-30 DIAGNOSIS — F32A Depression, unspecified: Secondary | ICD-10-CM

## 2018-05-30 LAB — POCT CREATININE
Creatinine, POCT: 0.7 mg/dL (ref 0.51–0.95)
GFR,Black POC: 103 *
GFR,Other POC: 89 *

## 2018-05-30 MED ORDER — FLUTICASONE PROPIONATE 50 MCG/ACT NA SUSP *I*
1.0000 | Freq: Every day | NASAL | 5 refills | Status: AC
Start: 2018-05-30 — End: 2021-02-18

## 2018-05-30 MED ORDER — IOHEXOL 350 MG/ML (OMNIPAQUE) IV SOLN *I*
1.0000 mL | Freq: Once | INTRAVENOUS | Status: AC
Start: 2018-05-30 — End: 2018-05-30
  Administered 2018-05-30: 50 mL via INTRAVENOUS

## 2018-05-30 MED ORDER — AMITRIPTYLINE HCL 25 MG PO TABS *I*
25.0000 mg | ORAL_TABLET | Freq: Every evening | ORAL | 1 refills | Status: DC
Start: 2018-05-30 — End: 2018-06-21

## 2018-05-30 NOTE — Progress Notes (Signed)
Still has tickle in her throat Can't seem to get rid of it  Also follow up on abnormal TSH

## 2018-05-30 NOTE — Progress Notes (Signed)
[facility]     HISTORY OF PRESENT ILLNESS     Brianna Barnett is a 67 y.o. female who presents for Cough and Lab Results    Patient comes in with concerns of persistent cough.  At her last office visit patient was started on Flovent.  Patient states that the Flovent has made no difference and she continues to cough significantly.  She will be leaving to [place] for 6 months within the week.  Has also had a 6 pound weight loss in the last month.  Also feels that the Zoloft that she was started on for anxiety and depression does not make her feel good.  Feels very jittery and shaky and on well on that.  Is wondering if she can go back on a very low dose of the amitriptyline that she used to be on before and that helped her.  Feels like the cough is brought on by postnasal drip.  Feels like she cannot clear her throat.  Feels like a big glob sits at the back of her throat.    ROS  Systemic symptoms-weight loss  Respiratory symptoms-positive for cough, no shortness of breath  Cardiac symptoms-no chest pain or palpitations  HEENT symptoms-post nasal drip  MEDICATIONS     Current Outpatient Prescriptions   Medication Sig    benzonatate (TESSALON) 200 MG capsule Take 1 capsule (200 mg total) by mouth 3 times daily as needed for Cough    meloxicam (MOBIC) 15 MG tablet Take 1 tablet (15 mg total) by mouth daily (Patient taking differently: Take 15 mg by mouth as needed   )    lisinopril (PRINIVIL,ZESTRIL) 5 MG tablet TAKE 1 TABLET BY MOUTH EVERY DAY    albuterol HFA 108 (90 Base) MCG/ACT inhaler Inhale 2 puffs into the lungs    omeprazole (PRILOSEC) 20 MG capsule Take 20 mg by mouth daily       fluticasone (FLONASE) 50 MCG/ACT nasal spray 1 spray by Nasal route daily    amitriptyline (ELAVIL) 25 MG tablet Take 1 tablet (25 mg total) by mouth nightly       Patient's medications were reviewed and updated at today's visit and no changes were made    ALLERGIES     Cat dander; Sulfa antibiotics; Coconut; and  Pneumovax 23  [pneumococcal vac polyvalent]      Patient's allergies were reviewed and updated at today's visit and no changes were made    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    Cramps of left lower extremity R25.2    Hyperglycemia R73.9    Hyperlipidemia E78.5    Insomnia G47.00    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    Mitral valve stenosis I05.0    Headache R51    Elevated blood pressure reading without diagnosis of hypertension R03.0    Incontinence of urine R32         TOBACCO USE HISTORY     History   Smoking Status    Former Smoker    Packs/day: 0.75    Years: 37.00    Types: Cigarettes    Quit date: 09/19/1999   Smokeless Tobacco    Never Used       PHYSICAL EXAM  BP 136/86 (BP Location: Left arm, Patient Position: Sitting, Cuff Size: adult)    Pulse 87    Temp 36.6 C (97.8 F) (Tympanic)    Ht 1.549 m (5\' 1" )    Wt 83.5 kg (184 lb)    SpO2 97%    BMI 34.77 kg/m       Physical Exam   Constitutional: She is well-developed, well-nourished, and in no distress. No distress.   HENT:   Head: Normocephalic and atraumatic.   Right Ear: External ear normal.   Left Ear: External ear normal.   Eyes: Right eye exhibits no discharge. Left eye exhibits no discharge.   Cardiovascular: Normal rate, regular rhythm and normal heart sounds.    No murmur heard.  Pulmonary/Chest: Effort normal and breath sounds normal. No respiratory distress. She has no wheezes.   Skin: She is not diaphoretic.   Psychiatric: Affect normal.         RECENT LABS   Spirometry done today shows FEV1 at 56% predicted.  Moderate obstruction      ASSESSMENT/PLAN     1. Cough  Flo vent was stopped as that i's not making a difference.  Recommend stat CT of the chest to further evaluate for persistent cough  - CT chest with contrast;  Future  - Spirometry (In Office); Future    2. Weight loss  Weight loss is concerning and a stat CAT scan of the chest was recommended.  Further management based on results.  - CT chest with contrast; Future    3. Anxiety and depression  No significant improvement of anxiety and depression on Zoloft.  Will wean patient off of Zoloft.  Weaning instructions reviewed with patient.  Start on amitriptyline 25 mg once a day.  Risks benefits side effects reviewed with patient.    4. Postnasal drip  Suspect that postnasal drip is likely causing cough.  Start on Flonase 2 sprays once a day for 10 days then decrease to 1 spray once a day.  Also adding Claritin 10 mg once a day.  Reevaluate at next office visit in a week.    Return in about 1 week (around 06/06/2018) for Follow-up.    --Patient instructed to call if symptoms are not improving or worsening  --Follow-up arranged    Signed: Hetty ElyJOSEPHINE Dezyre Hoefer, MD on 05/31/2018 at 4:16 PM

## 2018-05-30 NOTE — Patient Instructions (Signed)
Decrease zoloft to 1/2 tab every other day for a week and then stop

## 2018-05-31 ENCOUNTER — Encounter: Payer: Self-pay | Admitting: Primary Care

## 2018-06-03 ENCOUNTER — Telehealth: Payer: Self-pay | Admitting: Primary Care

## 2018-06-03 ENCOUNTER — Telehealth: Payer: Self-pay | Admitting: Pulmonology

## 2018-06-03 ENCOUNTER — Telehealth: Payer: Self-pay | Admitting: Thoracic/Foregut Surgery

## 2018-06-03 NOTE — Telephone Encounter (Signed)
-----   Message from Hetty ElyJosephine Ellis, MD sent at 05/30/2018  4:22 PM EDT -----  Please notify patient at CT scan done today looked okay except for another bulla in the right lower lobe like the one that was previously removed.  She has one left lung nodule that is stable.  One lymph node that is enlarged but stable.  She should see pulmonology.  She previously saw Dr[facility] Shawnee [facility] with [facility].  Please call their office to see if patient can be seen.  [place] is leaving to [place] 6 months on September 23 and needs to be seen prior to that.

## 2018-06-03 NOTE — Telephone Encounter (Signed)
Brianna Barnett - No, she does not need to be seen by us.     Her pulmonologist Dr. Shawnee KnappLevy can follow the lung nodules. The new bullae are moderate-large in size but without significant compressive atelectasis so resection would not likely be of great benefit in terms of improving dyspnea. The pt is certainly at an increased risk for pneumothorax but we would essentially be chasing our tail if we were to resect every bulla she formed.

## 2018-06-03 NOTE — Telephone Encounter (Signed)
Patient scheduled with Dr. Shawnee KnappLevy for September 24th; Dr. Hetty ElyJosephine Ellis is hoping patient can be seen sooner; writer [facility] or a sooner appointment.    Please contact patient at phone #[phone] if an earlier appointment is available.

## 2018-06-03 NOTE — Telephone Encounter (Signed)
Spoke with Dr Marquis Buggyaroline [place] office [phone], request placed, they will review and call back with update if pt needs to be seen

## 2018-06-03 NOTE — Telephone Encounter (Signed)
Spoke with pt, info given.  Confirmed upcoming OV here 06/07/18

## 2018-06-03 NOTE — Telephone Encounter (Signed)
[facility]

## 2018-06-03 NOTE — Telephone Encounter (Signed)
PCP calling. Asking for us to review patients recent CT scan and if she needs to be seen for a fuv by Dr. Yetta BarreJones.

## 2018-06-03 NOTE — Telephone Encounter (Signed)
Pt returned call, info given.  Spoke with Dr Shawnee KnappLevy office, he only see's pts on Tuesdays, OV schd for 06/11/18 at 10:30am.  Pt aware, she will also call their office tomorrow morning to see if they have any cancellations.

## 2018-06-03 NOTE — Telephone Encounter (Signed)
-----   Message from Hetty ElyJosephine Ellis, MD sent at 05/31/2018  4:17 PM EDT -----  See result not about CAT scan.  She has another bullae in her lung and should be seen by Dr. Marquis Buggyaroline [place].  She did surgery on her previously.  Please contact her office and have her review the CAT scan in patient's chart and see if patient needs a follow-up  JE

## 2018-06-03 NOTE — Telephone Encounter (Signed)
Melissa from Dr Barrett Henlearolyn  office called, she stated Brianna Barnett does not need to follow up with them, Pulmonary can follow her lung nodules.  She stated to refer to their telephone communication from earlier today

## 2018-06-03 NOTE — Telephone Encounter (Signed)
Message relayed to PCP.

## 2018-06-04 NOTE — Telephone Encounter (Signed)
Brianna Barnett scheduled on 9/24 at 10:15am.

## 2018-06-07 ENCOUNTER — Encounter: Payer: Self-pay | Admitting: Primary Care

## 2018-06-07 ENCOUNTER — Ambulatory Visit: Payer: Medicare (Managed Care) | Attending: Primary Care | Admitting: Primary Care

## 2018-06-07 VITALS — BP 136/86 | HR 92 | Temp 98.3°F | Ht 61.0 in | Wt 186.0 lb

## 2018-06-07 DIAGNOSIS — R059 Cough, unspecified: Secondary | ICD-10-CM

## 2018-06-07 DIAGNOSIS — F419 Anxiety disorder, unspecified: Secondary | ICD-10-CM

## 2018-06-07 DIAGNOSIS — F329 Major depressive disorder, single episode, unspecified: Secondary | ICD-10-CM

## 2018-06-07 DIAGNOSIS — G4700 Insomnia, unspecified: Secondary | ICD-10-CM

## 2018-06-07 DIAGNOSIS — R634 Abnormal weight loss: Secondary | ICD-10-CM

## 2018-06-07 DIAGNOSIS — J439 Emphysema, unspecified: Secondary | ICD-10-CM

## 2018-06-07 DIAGNOSIS — R0982 Postnasal drip: Secondary | ICD-10-CM

## 2018-06-07 DIAGNOSIS — F32A Depression, unspecified: Secondary | ICD-10-CM

## 2018-06-07 DIAGNOSIS — R05 Cough: Secondary | ICD-10-CM

## 2018-06-07 NOTE — Progress Notes (Signed)
[facility]     HISTORY OF PRESENT ILLNESS     Brianna Barnett is a 67 y.o. 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   Weight loss- this has now resolved  Insomnia- sleeping better on amitriptyline 25 mg and has no side effects  ROS    MEDICATIONS     Current Outpatient Prescriptions   Medication Sig    fluticasone (FLONASE) 50 MCG/ACT nasal spray 1 spray by Nasal route daily    amitriptyline (ELAVIL) 25 MG tablet Take 1 tablet (25 mg total) by mouth nightly    benzonatate (TESSALON) 200 MG capsule Take 1 capsule (200 mg total) by mouth 3 times daily as needed for Cough    meloxicam (MOBIC) 15 MG tablet Take 1 tablet (15 mg total) by mouth daily (Patient taking differently: Take 15 mg by mouth as needed   )    lisinopril (PRINIVIL,ZESTRIL) 5 MG tablet TAKE 1 TABLET BY MOUTH EVERY DAY    albuterol HFA 108 (90 Base) MCG/ACT inhaler Inhale 2 puffs into the lungs    omeprazole (PRILOSEC) 20 MG capsule Take 20 mg by mouth daily       FLOVENT HFA 220 MCG/ACT inhaler        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    Cramps of left lower extremity R25.2    Hyperglycemia R73.9    Hyperlipidemia E78.5    Insomnia G47.00    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    Mitral valve stenosis  I05.0    Headache R51    Elevated blood pressure reading without diagnosis of hypertension R03.0    Incontinence of urine R32         TOBACCO USE HISTORY     History   Smoking Status    Former Smoker    Packs/day: 0.75    Years: 37.00    Types: Cigarettes    Quit date: 09/19/1999   Smokeless Tobacco    Never Used       PHYSICAL EXAM     BP 136/86 (BP Location: Left arm, Patient Position: Sitting, Cuff Size: large adult)    Pulse 92    Temp 36.8 C (98.3 F) (Tympanic)    Ht 1.549 m (5\' 1" )    Wt 84.4 kg (186 lb)    SpO2 95%    BMI 35.14 kg/m         Physical Exam   Constitutional: She is well-developed, well-nourished, and in no distress.   HENT:   Head: Normocephalic and atraumatic.   Right Ear: External ear normal.   Left Ear: External ear normal.   Nose: Nose normal.  Cardiovascular: Normal rate, regular rhythm and normal heart sounds.    Pulmonary/Chest: Effort normal and breath sounds normal. No respiratory distress. She has no wheezes.   Psychiatric: Affect normal.         RECENT LABS     Recent Results (from the past 336 hour(s))   POCT creatinine    Collection Time: 05/30/18  2:00 PM   Result Value Ref Range    Creatinine, POCT 0.70 0.51 - 0.95 mg/dL    GFR,Other POC 89 *    GFR,Black POC 103 *            ASSESSMENT/PLAN     1. Cough  Unresolved with treating postnasal drip. No improvement with flovent. Will refer to pulmonology for persistent cough.     2. Bulla of lung  Has appt with pulmonology next week    3. Postnasal drip  Continue current management . Stop flonase after 2 weeks and use as needed    4. Anxiety and depression  Supportive care. Not suicidal. Has good social support    5. Weight loss  Resolved. Pt reassured    6.Insomnia  Continue amitriptyline at current dosage. No adverse side effects    Return in about 4 weeks (around 07/05/2018).    --Patient instructed to call if symptoms are not improving or worsening  --Follow-up arranged    Signed: Hetty Ely, MD on 06/08/2018 at 1:16  AM

## 2018-06-07 NOTE — Progress Notes (Signed)
Follow up cough not any better

## 2018-06-10 ENCOUNTER — Other Ambulatory Visit: Payer: Self-pay | Admitting: Pulmonology

## 2018-06-10 DIAGNOSIS — R059 Cough, unspecified: Secondary | ICD-10-CM

## 2018-06-11 ENCOUNTER — Ambulatory Visit: Payer: Medicare (Managed Care) | Attending: Pulmonology | Admitting: Pulmonology

## 2018-06-11 ENCOUNTER — Encounter: Payer: Self-pay | Admitting: Pulmonology

## 2018-06-11 ENCOUNTER — Ambulatory Visit: Payer: Medicare (Managed Care) | Attending: Gastroenterology

## 2018-06-11 VITALS — BP 140/83 | HR 93 | Temp 97.7°F | Resp 16 | Ht 62.91 in | Wt 184.4 lb

## 2018-06-11 DIAGNOSIS — R059 Cough, unspecified: Secondary | ICD-10-CM

## 2018-06-11 DIAGNOSIS — R05 Cough: Secondary | ICD-10-CM | POA: Insufficient documentation

## 2018-06-11 DIAGNOSIS — K219 Gastro-esophageal reflux disease without esophagitis: Secondary | ICD-10-CM | POA: Insufficient documentation

## 2018-06-11 DIAGNOSIS — J449 Chronic obstructive pulmonary disease, unspecified: Secondary | ICD-10-CM | POA: Insufficient documentation

## 2018-06-11 MED ORDER — UMECLIDINIUM-VILANTEROL 62.5-25 MCG/INH IN AEPB *I*
1.0000 | INHALATION_SPRAY | Freq: Every day | RESPIRATORY_TRACT | 11 refills | Status: AC
Start: 2018-06-11 — End: ?

## 2018-06-11 NOTE — Patient Instructions (Signed)
Your CXR and CT look fine---appear to be stable post op changes    Your breathing tests show an asthma like pattern from the infection this past spring    Will begin an inhaler to see if cough improves    Will refer to GI as I believe you need to have an endoscopy    Try to not over eat and have an empty stomach before bed---2-3 hrs of fasting before bed    Cough probably from some asthma like lung trouble and possibly from your heart burn

## 2018-06-12 ENCOUNTER — Telehealth: Payer: Self-pay | Admitting: Gastroenterology

## 2018-06-12 ENCOUNTER — Ambulatory Visit: Payer: Medicare (Managed Care) | Admitting: Primary Care

## 2018-06-12 ENCOUNTER — Telehealth: Payer: Self-pay

## 2018-06-12 NOTE — Telephone Encounter (Signed)
Called patient. Left vm advising patient to call back and confirm fuv with dr. Farris Has for 9/25 at 3:30pm on ac5. Patient confirmed with access ctr.

## 2018-06-12 NOTE — Telephone Encounter (Signed)
Brianna Barnett returning call from [employer]. Please see previous telephone encounter from earlier today. Writer spoke with Daromell. Ms. Kubicki is scheduled for:    Date: 9.26.19  Time: 3.30pm  Location: [facility]  Provider: Weg    The patient can be reached if necessary at [phone]

## 2018-06-12 NOTE — Telephone Encounter (Signed)
-----   Message from Wilhemena Durieanielle Marino, MD sent at 06/12/2018 10:05 AM EDT -----  pls overbook pt in 330 slot with Weg tomorrow  thanks

## 2018-06-13 ENCOUNTER — Encounter: Payer: Self-pay | Admitting: Gastroenterology

## 2018-06-13 ENCOUNTER — Other Ambulatory Visit
Admission: RE | Admit: 2018-06-13 | Discharge: 2018-06-13 | Disposition: A | Discharge status: Home / Self Care | Payer: Medicare (Managed Care) | Source: Ambulatory Visit | Attending: Gastroenterology | Admitting: Gastroenterology

## 2018-06-13 ENCOUNTER — Ambulatory Visit
Admission: RE | Admit: 2018-06-13 | Discharge: 2018-06-13 | Disposition: A | Discharge status: Home / Self Care | Payer: Medicare (Managed Care) | Source: Ambulatory Visit | Attending: Gastroenterology | Admitting: Gastroenterology

## 2018-06-13 ENCOUNTER — Telehealth: Payer: Self-pay

## 2018-06-13 VITALS — BP 137/63 | HR 109 | Ht 62.0 in | Wt 186.0 lb

## 2018-06-13 DIAGNOSIS — K219 Gastro-esophageal reflux disease without esophagitis: Secondary | ICD-10-CM

## 2018-06-13 DIAGNOSIS — K5909 Other constipation: Secondary | ICD-10-CM

## 2018-06-13 DIAGNOSIS — R634 Abnormal weight loss: Secondary | ICD-10-CM

## 2018-06-13 DIAGNOSIS — R1310 Dysphagia, unspecified: Secondary | ICD-10-CM

## 2018-06-13 DIAGNOSIS — R1013 Epigastric pain: Secondary | ICD-10-CM

## 2018-06-13 LAB — CBC
Hematocrit: 38 % (ref 34–45)
Hemoglobin: 11.7 g/dL (ref 11.2–15.7)
MCH: 24 pg/cell — ABNORMAL LOW (ref 26–32)
MCHC: 31 g/dL — ABNORMAL LOW (ref 32–36)
MCV: 78 fL — ABNORMAL LOW (ref 79–95)
Platelets: 372 10*3/uL — ABNORMAL HIGH (ref 160–370)
RBC: 4.9 MIL/uL (ref 3.9–5.2)
RDW: 16.6 % — ABNORMAL HIGH (ref 11.7–14.4)
WBC: 7.7 10*3/uL (ref 4.0–10.0)

## 2018-06-13 LAB — IRON: Iron: 23 ug/dL — ABNORMAL LOW (ref 34–165)

## 2018-06-13 LAB — FERRITIN: Ferritin: 13 ng/mL (ref 10–120)

## 2018-06-13 LAB — TRANSFERRIN: Transferrin: 288 mg/dL (ref 200–360)

## 2018-06-13 MED ORDER — POLYETHYLENE GLYCOL 3350 PO POWD *I*
17.0000 g | Freq: Every day | ORAL | 5 refills | Status: DC
Start: 2018-06-13 — End: 2019-01-27

## 2018-06-13 MED ORDER — OMEPRAZOLE 20 MG PO CPDR *I*
20.0000 mg | DELAYED_RELEASE_CAPSULE | Freq: Two times a day (BID) | ORAL | 5 refills | Status: AC
Start: 2018-06-13 — End: 2021-02-18

## 2018-06-13 NOTE — Discharge Instructions (Signed)
Schedule endoscopy and colonoscopy with extended prep  Labs today  CT scan  Pick up Rx from pharmacy for twice daily medications and miralax daily

## 2018-06-13 NOTE — Telephone Encounter (Signed)
CEN within 6 weeks any provider with EXTENDED prep, writer could not schedule, nothing avail.

## 2018-06-13 NOTE — H&P (Signed)
Division of Gastroenterology and Hepatology    Initial Outpatient Consult Note     Referring Physician    Earna Coder, MD    Reason for Consult    Cough    HPI     Brianna Barnett is a 67 y.o. female with a past medical history notable for GERD with prior esophageal dilation, ?Prior gastric ulcer, bullous lung disease s/p VATS and MV stenosis who has been referred to me at the [facility] Division of Gastroenterology and Hepatology for evaluation of abdominal pain, GERD and weight loss.    -Pt has lost 10 lbs since 03/2018. Mentions decreased appetite which has resulted in poor PO intake. Also mentions epigastric abdominal pain that is worse by PO intake. Pain is described as a squeezing sensation, worse with heavy foods such as meat and breads, (usually 1 hr after meals) and occasionally wakes her from sleep. Pain radiates to back of chest, lasts 15 mins - 1 hr and then resolves on its own. Symptoms mainly occur at night  -Admits to dysphagia with chocking sensation when swallowing thick foods such as meats and breads. No pain with swallowing.   -Admits to heart burn and nocturnal cough for which she takes OTC omeprazole 20 mg daily in the morning. This improves heart burn but not above symptoms, which mainly occur at night.    -Admits to consuming large meals prior to bedtime.   -Consumes ibuprofen 4 times per week.   -Consumes alcohol 4 glasses per month. Former smoker, smoked for 35 years, quit ~ 18 yrs ago.   -Admits to PUD in her 33-30s for which she was on medication for some time many years ago. Also reports EGD a few years back at [facility] for a dilation. No report available.   -Admits to long history of constipation. Usually has only 1-2 BMs per week, though does note periods will she will have BMs daily. Admits need to strain for BMs on occasion. Denies black or bloody stools.   -Never had a colonoscopy. No FH of colon cancer or polyps.   -Recent CT chest noted normal appearing  upper GI anatomy    ROS  Constitutional: No fevers, night sweats, fatigue, generalized weakness, or loss of appetite  Eyes: No vision changes, eye pain, conjunctival injection, or diplopia   Ears, nose, and throat: No rhinorrhea, epistaxis, gingival bleeding, or sore throat   Cardiovascular: No chest pain, palpitations, dyspnea on exertion, orthopnea, paroxysmal nocturnal dyspnea, lower extremity edema, claudication, or syncope  Pulmonary: No cough, sputum production, shortness of breath, wheezing, or hemoptysis   Gastrointestinal: See HPI  Genitourinary: No dysuria, polyuria, hematuria, or urinary incontinence.   Endocrine: No heat or cold intolerance, diaphoresis, hair thinning, polyuria, polydipsia, or polyphagia  Hematologic: No easy bruising or bleeding, anemia, thrombocytopenia, lymphadenopathy, or history of blood transfusion  Musculoskeletal: No joint pain, stiffness, erythema, warmth, or swelling or myalgias  Integumentary: No rashes, lesions, or nodules  Neurologic: No headaches, loss of extremity strength or sensation, paresthesias, loss of balance, or seizures  Psychiatric: No anxiety, depression, or insomnia   Allergy/Immunology: No allergic reaction to pets, foods, materials, or the environment      Problem List  Patient Active Problem List   Diagnosis Code    Other pulmonary insufficiency, not elsewhere classified J98.4    GERD (gastroesophageal reflux disease) K21.9    Arthritis M19.90    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    Cramps of  left lower extremity R25.2    Hyperglycemia R73.9    Hyperlipidemia E78.5    Insomnia G47.00    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    Mitral valve stenosis I05.0    Headache R51    Elevated blood pressure reading without diagnosis of hypertension R03.0    Incontinence of urine R32       Medical History  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     Incontinence of urine     Other pulmonary insufficiency, not elsewhere classified 06/06/2016       Past Surgical History:   Procedure Laterality Date    ANKLE SURGERY  2013    Fracture on the right crush injury on the left    DENTAL SURGERY      esophageal dilatation      EYE SURGERY Right     for detached retina    FOOT SURGERY Left 2011    KNEE SURGERY Left     3 times    PR THORACOSCOPY W/THERA WEDGE RESEXN INITIAL UNILAT Right 06/16/2016    Procedure: THORACOSCOPY (VATS) Wedge;  Surgeon: Larene Beach, MD;  Location: [facility] MAIN OR;  Service: Other    TONSILLECTOMY      TUBAL LIGATION      pp btl @ umbilicus       Social History     Social History    Marital status: Divorced     Spouse name: N/A    Number of children: N/A    Years of education: N/A     Occupational History    Not on file.     Social History Main Topics    Smoking status: Former Smoker     Packs/day: 0.75     Years: 37.00     Types: Cigarettes     Quit date: 09/19/1999    Smokeless tobacco: Never Used    Alcohol use Yes      Comment: 1 or less a month    Drug use: No    Sexual activity: No     Social History Narrative    No narrative on file         Family History   Problem Relation Age of Onset    Heart Disease Mother     GERD Mother     Arthritis Mother     COPD Mother     Alzheimer's disease Mother     Osteoporosis Mother     No Known Problems Brother        GI-Related Family History  No FH of GI cancers    Current Outpatient Prescriptions on File Prior to Encounter   Medication Sig Dispense Refill    umeclidinium-vilanterol (ANORO ELLIPTA) 62.5-25 MCG/INH inhaler Inhale 1 puff into the lungs daily 1 Inhaler 11    fluticasone (FLONASE) 50 MCG/ACT nasal spray 1 spray by Nasal route daily 16 g 5    amitriptyline (ELAVIL) 25 MG tablet Take 1 tablet (25 mg total) by mouth nightly 30 tablet 1    benzonatate (TESSALON) 200 MG capsule Take 1 capsule (200 mg total) by mouth 3 times daily as needed for Cough 21 capsule 0     meloxicam (MOBIC) 15 MG tablet Take 1 tablet (15 mg total) by mouth daily (Patient taking differently: Take 15 mg by mouth as needed   ) 30 tablet 5  lisinopril (PRINIVIL,ZESTRIL) 5 MG tablet TAKE 1 TABLET BY MOUTH EVERY DAY 90 tablet 1    omeprazole (PRILOSEC) 20 MG capsule Take 20 mg by mouth daily         FLOVENT HFA 220 MCG/ACT inhaler       albuterol HFA 108 (90 Base) MCG/ACT inhaler Inhale 2 puffs into the lungs       No current facility-administered medications on file prior to encounter.        Allergies   Allergen Reactions    Cat Dander Shortness Of Breath and Itching    Sulfa Antibiotics Hives, Itching and Nausea And Vomiting    Coconut Other (See Comments)     Reaction not specified      Pneumovax 23  [Pneumococcal Vac Polyvalent] Hives       Physical Exam  General: NAD  BP 137/63    Pulse 109    Ht 157.5 cm (_0 )    Wt 84.4 kg (186 lb)    BMI 34.02 kg/m   Skin: Warm and dry. No rashes.  HEENT: NCAT. PERRL. No conjunctival injection. Clear oropharynx.  Neck: No masses or thyromegaly.     Heart: RRR. S1 and S2.   Lungs: Normal respiratory effort. CTAB.  Abdomen: Normoactive bowel sounds. Tender in LLQ to deep palpation. Nondistended. No guarding or rebound tenderness. No masses.    Musculoskeletal: No red, swollen, or tender joints.  Vascular: Warm extremities.  Lymphatic: No cervical lymphadenopathy.  Neurologic: A&O x3. No focal deficits.     Laboratory Data  Lab Results   Component Value Date    NA 143 01/19/2018    K 3.6 01/19/2018    CL 103 01/19/2018    CO2 29 (H) 01/19/2018    UN 7 01/19/2018    CREAT 0.81 01/19/2018     Lab Results   Component Value Date    ALK 106 (H) 01/19/2018    TB <0.2 01/19/2018    ALB 3.9 01/19/2018    ALT 20 01/19/2018    AST 27 01/19/2018     Lab Results   Component Value Date    WBC 5.8 01/19/2018    HGB 11.3 01/19/2018    HCT 37 01/19/2018    MCV 78 (L) 01/19/2018    PLT 337 01/19/2018     No results found for: LIP, AMY   Lab Results   Component Value Date     INR 1.0 06/07/2016       Endoscopy  No reports available    Imaging   Upper abdomen:  Unremarkable.    Assessment and Recommendations  67 y.o. female with a past medical history notable for GERD with prior esophageal dilation, ?Prior gastric ulcer, bullous lung disease s/p VATS and MV stenosis who has been referred to me at the [facility] Division of Gastroenterology and Hepatology for evaluation of abdominal pain, GERD and weight loss.    Symptoms of epigastric pain, worse with meals concerning for PUD in setting of multiple risk factors, including chronic regular NSAID use, history of PUD, GERD and GERD unfriendly lifestyle. Alternative etiologies can include biliary etiology or functional disease. Pt also with poorly controlled GERD, as evidence by nocturnal cough and dysphagia. I suspect she has poorly-timed PPI dosing and would benefit from BID therapy. Finally, pt also has LLQ tenderness on exam. Given her weight loss and recent CT chest, will complete cross-sectional imaging evaluation with CT A/P to assess for occult malignancy.     #  Epigastric pain/GERD/Dysphagia  -Plan for EGD to assess for ulcerative disease and esophageal pathology  -Increase omeprazole to BID - ideally this will help improve nocturnal symptoms  -Limit/refrain from NSAID use. Recommend tylenol as first line  -LFTs and CT Abdomen - for biliary pathology    #Weight loss and IDA  -CBC, iron studies  -CT A/P - for occult malignancy  -Colonoscopy - is due for CRC screening. Will need extended prep given history of constipation    #Constipation  -Advised miralax daily with titration to 1 BM daily.   -Colonoscopy as above.     I plan to see Nanci Lakatos back for follow up in our clinic after her EGD/Colonoscopy.     It was a pleasure seeing Saharra Santo in the [facility]. Please do not hesitate to contact me with and further questions or concerns.     Candie Echevaria, MD    Fellow (PGY-6), Division of Gastroenterology and Hepatology   [facility], [place], [place]  [place], [place] [address]  Telephone: [phone]  Fax: [phone]

## 2018-06-14 ENCOUNTER — Other Ambulatory Visit: Payer: Self-pay | Admitting: Gastroenterology

## 2018-06-14 MED ORDER — FERROUS GLUCONATE 324 (37.5 FE) MG PO TABS *WRAPPED*
324.0000 mg | ORAL_TABLET | Freq: Every day | ORAL | 3 refills | Status: AC
Start: 2018-06-14 — End: 2018-12-11

## 2018-06-17 ENCOUNTER — Telehealth: Payer: Self-pay | Admitting: Gastroenterology

## 2018-06-17 NOTE — Telephone Encounter (Signed)
Brianna Barnett calling to check on the status of appointment request for CEN. Please refer to telephone encounter 9.26.19. Writer advised, head scheduler is working on trying to get her scheduled. She would like a call back with an update on when these appointments can be scheduled. Patient can be reached at [phone]

## 2018-06-17 NOTE — Progress Notes (Signed)
Patient:  Brianna Barnett  MRN:  [id number]    Pulmonary Clinic Note:   Brianna Barnett, M.D.    Dear Dr. Rennis Barnett:    Brianna Barnett was interviewed and examined in the Pulmonary Clinic on June 11, 2018.  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  Since last being seen shes been doing pretty well.  Her post-operative course was notable for some moderate chest discomfort.  She was doing fairly well until this past spring when she developed a bad episode of bronchitis when she was in [place].  She describes having a fever for a few days but no chest pain.  She was short of breath and had a non-productive cough.  No hemoptysis.  She improved on a course of antibiotics, but still had residual cough this past summer.  A chest x-ray was obtained in July of this year.  I personally reviewed it.  The film from July looks very similar to her November 2017 film that was taken several months post-op from her lung resection.    Presently she has an ongoing non-productive cough.  She also has a history of reflux disease.  More recently she developed some upper abdominal pain that occurs after eating.  She would even vomit undigested food.  This has been going on for about a month or so.  Her weight is down about 9 pounds.  She reports having somewhat having a lot of dysphasia for some time well before the upper abdominal pain.      Her medication list was reviewed and updated.  She has been on Omeprazole for heartburn.  She also takes as needed Albuterol but not very often.  Her other medications include Elavil, Tessalon, Flonase, Lisinopril and Mobic.      Review of systems is notable for the recent weight loss.  She hasnt had much in the way of sinus disease or congestion.  She sleeps in a hospital-type bed that she can elevate the head of the bed fairly consistently.  At night  she describes water brash and some choking symptoms.  No edema or rash.  Otherwise her ROS was non-contributory/negative.    On my exam today her blood pressure measured 140/83 with a resting heart rate of 93.  Oxygen saturation was 97% on room air.  Her oropharynx was clear.  No cervical or supraclavicular nodes.  There was no thrush or obvious post-nasal drip.  Her lungs were clear but perhaps some slight expiratory wheeze.  This was very minimal.  Cardiac tones regular.  No murmur.  Her legs were symmetric without edema or rash.  No arthritic deformities.    She underwent pulmonary function tests today that showed moderate obstructive lung disease with a FEV1 measuring 1.5 liters at 74% of predicted and a vital capacity of 2.3 liters at 91% of predicted.  The FEV1/FVC ratio was 65% demonstrating significant obstructive disease with a Z-score of 1.92.  Compared to her prior pulmonary function test she now has evidence for obstructive physiology and she also has a modest reduction in her FEV1.    I reviewed her recent imaging studies including the CT scan from September and her chest x-ray from this past July.  The CT scan again demonstrates the residual cystic area in her right lung.  She had some stable but mildly enlarged nodes.  No specific  concerns for nodules or other parenchymal disease.    In summary Brianna Barnett has symptoms of dysphasia and some upper abdominal discomfort.  Given her long history of reflux it appears that she needs a repeat endoscopic exam sometime soon.  I hoping this can be scheduled before she heads for [place].  Her dysphasia, vomiting and weight loss may certainly be part of the reason for her chronic residual cough.    Her pulmonary function tests showed new obstructive lung disease.  This has developed on the heels of a recent URI.  She may have some component of reactive airways disease from the previous viral infection.  Ive started her on a bronchodilator  (LABA/LAMA).    In the near term Ive asked her to try to go to bed with an empty stomach by fasting for 2 to 3 hours before bedtime.    Given her [place] travels, I may not be able to see her back until the spring but she will stay in touch if any problems come up.  I will also try to push ahead and see if we can get her into GI quickly.    Sincerely,        Brianna Barnett. Brianna Barnett, M.D.

## 2018-06-21 ENCOUNTER — Other Ambulatory Visit: Payer: Self-pay | Admitting: Primary Care

## 2018-06-21 MED ORDER — AMITRIPTYLINE HCL 25 MG PO TABS *I*
25.0000 mg | ORAL_TABLET | Freq: Every evening | ORAL | 1 refills | Status: DC
Start: 2018-06-21 — End: 2018-11-11

## 2018-06-21 MED ORDER — MELOXICAM 15 MG PO TABS *I*
15.0000 mg | ORAL_TABLET | Freq: Every day | ORAL | 5 refills | Status: AC
Start: 2018-06-21 — End: ?

## 2018-06-21 NOTE — Telephone Encounter (Signed)
Last appointment: 06/07/2018  Next appointment: 07/03/2018

## 2018-06-26 ENCOUNTER — Other Ambulatory Visit: Payer: Self-pay | Admitting: Gastroenterology

## 2018-06-26 MED ORDER — FLEET ENEMA 7-19 GM/118ML RE ENEM *I*
ENEMA | RECTAL | 0 refills | Status: DC
Start: 2018-06-26 — End: 2019-01-27

## 2018-06-26 MED ORDER — CITRATE OF MAGNESIA PO SOLN *I*
ORAL | 0 refills | Status: DC
Start: 2018-06-26 — End: 2019-01-27

## 2018-06-26 MED ORDER — BISACODYL EC 5 MG PO TBEC *I*
DELAYED_RELEASE_TABLET | ORAL | 0 refills | Status: DC
Start: 2018-06-26 — End: 2019-01-27

## 2018-06-26 MED ORDER — PEG 3350-KCL-NABCB-NACL-NASULF 236 GM PO SOLR *I*
ORAL | 0 refills | Status: DC
Start: 2018-06-26 — End: 2019-01-27

## 2018-06-26 NOTE — Telephone Encounter (Signed)
Called patient. Advised patient that prep meds were approved and sent to her pharmacy.

## 2018-06-26 NOTE — Telephone Encounter (Signed)
Called patient. Scheduled cen for 11/1 at 2:15pm at [facility]. Patient confirmed. Sent extended prep instructions to my chart. Put in for prep meds to be filled by dr. Farris Has.

## 2018-06-26 NOTE — Telephone Encounter (Signed)
Brianna Barnett calling to check on status of scheduling CEN. Please refer to previous note within this encounter. Patient is trying to schedule asap, as she is supposed to be going out of town around the beginning of November. Please contact patient at [phone]

## 2018-07-01 ENCOUNTER — Telehealth: Payer: Self-pay | Admitting: Primary Care

## 2018-07-01 NOTE — Telephone Encounter (Signed)
Camelia Eng is seeing an internal doctor, Dr. Farris Has at [facility]

## 2018-07-02 NOTE — Telephone Encounter (Signed)
Ms. Haughn is calling to cancel her appointment which is currently scheduled for 08/02/18 at [facility].     Has the appointment been cancelled? no    Has the appointment been rescheduled? no    Date of new appointment? N/a (She's home from out of town in mid December)     Does the patient need a call back to reschedule?yes    Patient can be contacted back at [phone]

## 2018-07-02 NOTE — Telephone Encounter (Signed)
Left vm for patient to call and  Reschedule procedure at [facility] with Dr. Celine Ahr

## 2018-07-03 ENCOUNTER — Ambulatory Visit: Payer: Medicare (Managed Care) | Admitting: Primary Care

## 2018-07-04 ENCOUNTER — Ambulatory Visit
Admission: RE | Admit: 2018-07-04 | Discharge: 2018-07-04 | Disposition: A | Discharge status: Home / Self Care | Payer: Medicare (Managed Care) | Source: Ambulatory Visit | Attending: Gastroenterology | Admitting: Gastroenterology

## 2018-07-04 DIAGNOSIS — R1013 Epigastric pain: Secondary | ICD-10-CM | POA: Insufficient documentation

## 2018-07-04 DIAGNOSIS — R634 Abnormal weight loss: Secondary | ICD-10-CM

## 2018-07-04 MED ORDER — STERILE WATER FOR IRRIGATION IR SOLN *I*
900.0000 mL | Freq: Once | Status: AC
Start: 2018-07-04 — End: 2018-07-04
  Administered 2018-07-04: 900 mL via ORAL

## 2018-07-04 MED ORDER — IOHEXOL 350 MG/ML (OMNIPAQUE) IV SOLN *I*
1.0000 mL | Freq: Once | INTRAVENOUS | Status: AC
Start: 2018-07-04 — End: 2018-07-04
  Administered 2018-07-04: 125 mL via INTRAVENOUS

## 2018-07-05 ENCOUNTER — Encounter: Payer: Self-pay | Admitting: Gastroenterology

## 2018-08-23 ENCOUNTER — Encounter: Payer: Self-pay | Admitting: Gastroenterology

## 2018-09-02 MED ORDER — BISACODYL EC 5 MG PO TBEC *I*
DELAYED_RELEASE_TABLET | ORAL | 0 refills | Status: DC
Start: 2018-09-02 — End: 2019-01-27

## 2018-09-02 MED ORDER — CITRATE OF MAGNESIA PO SOLN *I*
ORAL | 0 refills | Status: DC
Start: 2018-09-02 — End: 2019-01-27

## 2018-09-02 MED ORDER — FLEET ENEMA 7-19 GM/118ML RE ENEM *I*
ENEMA | RECTAL | 0 refills | Status: DC
Start: 2018-09-02 — End: 2019-01-27

## 2018-09-02 MED ORDER — PEG 3350-KCL-NABCB-NACL-NASULF 236 GM PO SOLR *I*
ORAL | 0 refills | Status: DC
Start: 2018-09-02 — End: 2019-01-27

## 2018-09-02 NOTE — Telephone Encounter (Signed)
Called pt to discuss her medication questions. No answer. LVM to check my chart account for sent instructions and to return call with questions.

## 2018-09-02 NOTE — Telephone Encounter (Signed)
Patient is calling in regards to prep for her upcoming procedure on 12/20. Please provided prep instructions via MyChart. She states she has a few questions regarding her medications.  She can be reached at [phone].

## 2018-09-02 NOTE — Telephone Encounter (Signed)
Extended prep sent to pharmacy  Thank you - Quenton Recendez D'Angelo, PA

## 2018-09-03 NOTE — Telephone Encounter (Signed)
Terri,   Your prep medications have been sent to your pharmacy.  Ameenah Prosser,RN  [facility]

## 2018-09-05 ENCOUNTER — Telehealth: Payer: Self-pay | Admitting: Gastroenterology

## 2018-09-05 NOTE — Telephone Encounter (Signed)
Pre procedure call made to [name] for EGD/colonoscopy scheduled on 09/06/18 with Dr. Francisca [name]. Informed patient to arrive at department at 1215PM             The following information was confirmed:     1. Patient's arrival time and date   2. Parking information/location given to patient  3. Directions to department given  4. Patient to bring the following: Insurance card, Photo ID, Pre procedure medication list, Health History form  5. Any further questions, instructed to reach us at [phone], Monday thru Friday, 7:30am to 5:00pm.      Your driver that will stay with you and drive you home when discharged. Driver:Friend    6. Does patient have a pacemaker?  No      7. Does patient take any blood thinners, blood pressure medication or insulin? No

## 2018-09-06 ENCOUNTER — Ambulatory Visit
Admission: RE | Admit: 2018-09-06 | Discharge: 2018-09-06 | Disposition: A | Discharge status: Home / Self Care | Payer: Medicare (Managed Care) | Source: Ambulatory Visit | Attending: Gastroenterology | Admitting: Gastroenterology

## 2018-09-06 ENCOUNTER — Encounter
Admission: RE | Disposition: A | Discharge status: Home / Self Care | Payer: Medicare (Managed Care) | Source: Ambulatory Visit | Attending: Gastroenterology

## 2018-09-06 DIAGNOSIS — K2960 Other gastritis without bleeding: Secondary | ICD-10-CM

## 2018-09-06 DIAGNOSIS — D649 Anemia, unspecified: Secondary | ICD-10-CM

## 2018-09-06 DIAGNOSIS — D509 Iron deficiency anemia, unspecified: Secondary | ICD-10-CM | POA: Insufficient documentation

## 2018-09-06 DIAGNOSIS — R634 Abnormal weight loss: Secondary | ICD-10-CM

## 2018-09-06 DIAGNOSIS — Z5309 Procedure and treatment not carried out because of other contraindication: Secondary | ICD-10-CM | POA: Insufficient documentation

## 2018-09-06 DIAGNOSIS — K3189 Other diseases of stomach and duodenum: Secondary | ICD-10-CM | POA: Insufficient documentation

## 2018-09-06 DIAGNOSIS — K2950 Unspecified chronic gastritis without bleeding: Secondary | ICD-10-CM | POA: Insufficient documentation

## 2018-09-06 DIAGNOSIS — R109 Unspecified abdominal pain: Secondary | ICD-10-CM

## 2018-09-06 DIAGNOSIS — K219 Gastro-esophageal reflux disease without esophagitis: Secondary | ICD-10-CM | POA: Insufficient documentation

## 2018-09-06 DIAGNOSIS — Z8379 Family history of other diseases of the digestive system: Secondary | ICD-10-CM | POA: Insufficient documentation

## 2018-09-06 DIAGNOSIS — K449 Diaphragmatic hernia without obstruction or gangrene: Secondary | ICD-10-CM | POA: Insufficient documentation

## 2018-09-06 HISTORY — DX: Unspecified asthma, uncomplicated: J45.909

## 2018-09-06 HISTORY — DX: Chronic obstructive pulmonary disease, unspecified: J44.9

## 2018-09-06 SURGERY — COLONOSCOPY

## 2018-09-06 MED ORDER — LIDOCAINE HCL (PF) 1 % IJ SOLN *I*
0.1000 mL | INTRAMUSCULAR | Status: DC | PRN
Start: 2018-09-06 — End: 2018-09-06

## 2018-09-06 MED ORDER — BENZOCAINE 20 % MT SOLN WRAPPED *I*
1.0000 | Freq: Once | OROMUCOSAL | Status: DC
Start: 2018-09-06 — End: 2018-09-06

## 2018-09-06 MED ORDER — SODIUM CHLORIDE 0.9 % FLUSH FOR PUMPS *I*
0.0000 mL/h | INTRAVENOUS | Status: DC | PRN
Start: 2018-09-06 — End: 2018-09-06

## 2018-09-06 MED ORDER — SODIUM CHLORIDE 0.9 % IV SOLN WRAPPED *I*
30.0000 mL/h | Status: DC
Start: 2018-09-06 — End: 2018-09-06
  Administered 2018-09-06: 30 mL/h via INTRAVENOUS

## 2018-09-06 MED ORDER — FENTANYL CITRATE 50 MCG/ML IJ SOLN *WRAPPED*
INTRAMUSCULAR | Status: AC | PRN
Start: 2018-09-06 — End: 2018-09-06
  Administered 2018-09-06 (×2): 25 ug via INTRAVENOUS
  Administered 2018-09-06: 50 ug via INTRAVENOUS

## 2018-09-06 MED ORDER — MIDAZOLAM HCL 1 MG/ML IJ SOLN *I* WRAPPED
INTRAMUSCULAR | Status: AC | PRN
Start: 2018-09-06 — End: 2018-09-06
  Administered 2018-09-06: 2 mg via INTRAVENOUS
  Administered 2018-09-06: 1 mg via INTRAVENOUS
  Administered 2018-09-06 (×3): 2 mg via INTRAVENOUS

## 2018-09-06 MED ORDER — FENTANYL CITRATE 50 MCG/ML IJ SOLN *WRAPPED*
INTRAMUSCULAR | Status: AC
Start: 2018-09-06 — End: 2018-09-06
  Filled 2018-09-06: qty 2

## 2018-09-06 MED ORDER — MIDAZOLAM HCL 5 MG/5ML IJ SOLN *I*
INTRAMUSCULAR | Status: AC
Start: 2018-09-06 — End: 2018-09-06
  Filled 2018-09-06: qty 10

## 2018-09-06 SURGICAL SUPPLY — 17 items
CLIP HEMOSTASIS ENDO INSTINCT 7FRX230CM (Supply) IMPLANT
ELECTRODE ADULT POLYHESIVE PAT RETURN (Supply) IMPLANT
FORCEPS BIOPSY RADIAL JAW LG (Other) IMPLANT
FORCEPS DISPOSABLE ALLIGATOR JAW W/NEEDLE (Supply) ×2 IMPLANT
PROBE CIRCUMFERENTAIL FIRE (Supply) IMPLANT
PROBE COAG 2000A 6.9FR L2.2M INTEGR STYL W/ FLTR FOR FLX ENDOSCP INTERVENTION FIAPC (Supply) IMPLANT
PROBE COAG 6.9FR L2.2M CNVNENT BLT IN FLTR 2200SC SIMPLIFIED SETUP PLUG PLAY FUNCTIONALITY (Supply) IMPLANT
PROBE SIDE FIRE (Supply)
PROBE STRAIGHT FIRE (Supply)
RETRIEVER ROTH NET FOREIGN BO USE 241906 (Supply) IMPLANT
SNARE MICRO MINI (Supply)
SNARE MINI SOFT ACU 1.5 X 3 (Supply) IMPLANT
SNARE POLYP 1CMX1.5CM SFT GI MINI MIC OVL ACUSNR (Supply) IMPLANT
SNARE SOFT JUMBO ENDO DISPO (Supply) IMPLANT
SNARE STANDARD SOFT ACU 2.5 X 5.5 (Supply) IMPLANT
TRAP POLYP MULTICHAMBER DISP OPTIMIZER (Other) IMPLANT
TRAPS POLY (Other)

## 2018-09-06 NOTE — Preop H&P (Signed)
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    Incontinence of urine     Other pulmonary insufficiency, not elsewhere classified 06/06/2016     Past Surgical History:   Procedure Laterality Date    ANKLE SURGERY  2013    Fracture on the right crush injury on the left    DENTAL SURGERY      esophageal dilatation      EYE SURGERY Right     for detached retina    FOOT SURGERY Left 2011    KNEE SURGERY Left     3 times    LUNG REMOVAL, PARTIAL      PR THORACOSCOPY W/THERA WEDGE RESEXN INITIAL UNILAT Right 06/16/2016    Procedure: THORACOSCOPY (VATS) Wedge;  Surgeon: Barrett HenleJones, Carolyn, MD;  [facility];  Service: Other    TONSILLECTOMY      TUBAL LIGATION      pp btl @ umbilicus     Family History   Problem Relation Age of Onset    Heart Disease Mother     GERD Mother     Arthritis Mother     COPD Mother     Alzheimer's disease Mother     Osteoporosis Mother     No Known Problems Brother      Social History     Socioeconomic History    Marital status: Divorced     Spouse name: Not on file    Number of children: Not on file    Years of education: Not on file    Highest education level: Not on file   Tobacco Use    Smoking status: Former Smoker     Packs/day: 0.75     Years: 37.00     Pack years: 27.75     Types: Cigarettes     Last attempt to quit: 09/19/1999     Years since quitting: 18.9    Smokeless tobacco: Never Used   Substance and Sexual Activity    Alcohol use: Yes     Comment: 1 or less a month    Drug use: No    Sexual activity: Never   Other Topics Concern    Not on file   Social History Narrative    Not on file       Allergies:    Allergies   Allergen Reactions    Cat Dander Shortness Of Breath  and Itching    Sulfa Antibiotics Hives, Itching and Nausea And Vomiting    Coconut Other (See Comments)     Reaction not specified      Pneumovax 23  [Pneumococcal Vac Polyvalent] Hives       Medications:  Medications Prior to Admission   Medication Sig    sertraline (ZOLOFT) 50 MG tablet Take 50 mg by mouth daily    bisacodyl (DULCOLAX) 5 MG EC tablet Please take per extended colonoscopy prep instructions.    FLEET ENEMA Please take per extended colonoscopy prep instructions.    magnesium citrate (MAGNESIUM CITRATE) solution Please take per extended colonoscopy prep instructions.    polyethylene glycol (GOLYTELY) 236 GM solution Please take per extended  colonoscopy prep instructions.    polyethylene glycol (GOLYTELY) 236 GM solution Follow extended prep instructions    bisacodyl (BISACODYL) 5 MG EC tablet Follow extended prep instructions    magnesium citrate (CITROMA) solution Follow extended prep instructions    FLEET ENEMA Follow extended prep instructions    amitriptyline (ELAVIL) 25 MG tablet Take 1 tablet (25 mg total) by mouth nightly    meloxicam (MOBIC) 15 MG tablet Take 1 tablet (15 mg total) by mouth daily    ferrous gluconate (FERGON) 324 (37.5 Fe) MG tablet Take 1 tablet (324 mg total) by mouth daily (with breakfast)    omeprazole (PRILOSEC) 20 MG capsule Take 1 capsule (20 mg total) by mouth 2 times daily (before meals)    polyethylene glycol (GLYCOLAX) powder Take 17 g by mouth daily   Mix in 8 oz water or juice and drink.    umeclidinium-vilanterol (ANORO ELLIPTA) 62.5-25 MCG/INH inhaler Inhale 1 puff into the lungs daily    fluticasone (FLONASE) 50 MCG/ACT nasal spray 1 spray by Nasal route daily    lisinopril (PRINIVIL,ZESTRIL) 5 MG tablet TAKE 1 TABLET BY MOUTH EVERY DAY    albuterol HFA 108 (90 Base) MCG/ACT inhaler Inhale 2 puffs into the lungs    FLOVENT HFA 220 MCG/ACT inhaler     benzonatate (TESSALON) 200 MG capsule Take 1 capsule (200 mg total) by mouth 3 times daily  as needed for Cough      Current Facility-Administered Medications   Medication Dose Route Frequency    sodium chloride 0.9 % FLUSH REQUIRED IF PATIENT HAS IV  0-500 mL/hr Intravenous PRN    sodium chloride 0.9 % IV  30 mL/hr Intravenous Continuous    benzocaine (HURRICAINE) 20 % mouth spray 1 spray  1 spray Mouth/Throat Once    lidocaine PF 1 % injection 0.1 mL  0.1 mL Subcutaneous PRN     Vitals:    09/06/18 1304   BP: 148/84   Pulse: 96   Resp: 12   Temp:    Weight:    Height:        ROS:  GI:See HPI.    Physical Examination:  Head/Nose/Throat:negative  Lungs:Lungs clear  Cardiovascular:normal S1 and S2  Abdomen: abdomen soft, non-tender, nondistended, normal active bowel sounds, no masses or organomegaly      Lab Results: none    Radiology impressions (last 30 days):  No results found.    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 Updates to Patient's Condition (to be completed by a provider privileged to complete a H&P, following reassessment of the patient by the provider):    Full H&P done today; no updates needed.  II. Procedure Readiness   I have reviewed the patient's H&P and updated condition. By completing and signing this form, I attest that this patient is ready for surgery/procedure.      III. Attestation   I have reviewed the  updated information regarding the patient's condition and it is appropriate to proceed with the planned surgery/procedure.    Francisca December, MD as of 1:12 PM 09/06/2018

## 2018-09-06 NOTE — Procedures (Signed)
Flexible Sigmoidoscopy Procedure Note   Date of Procedure: 09/06/18  Primary Physician: Hetty ElyEllis, Josephine, MD  Attending Physician:  Francisca Decemberanya Arrianna Catala, MD  Fellow:  none  Indications:  Iron deficiency anemia and Weight loss  Previous colonoscopy: No  Medications: Fentanyl 100 mcg IV and Midazolam 9 mg IV were administered incrementally over the course of the procedure to achieve an adequate level of moderate sedation. EGD and colonoscopy total.    Moderate Sedation Face Times  Start Time: 1315  End Time: 1338  Duration (minutes): 23 Minutes     I provided moderate sedation.  During this time I was face-to-face with the patient and supervising the RN; who monitored the patient's level of consciousness and physiological status.     Scopes: [id number]    Procedure Details: Full disclosures of risks were reviewed with patient as detailed on the consent form. The patient was placed in the left lateral decubitus position and monitored with continuous pulse oximetry, interval blood pressure monitoring, and direct observations.   After anorectal examination was performed, the colonoscope was inserted into the rectum and advanced under direct vision to the splenic flexure. The procedure was considered not difficult.  During withdrawal examination, the final quality of the prep was [facility] Bow[facility] Scale:    Transverse Colon: Grade 0- (unprepared colon segment with mucosa not seen because of solid stool that can not be cleared)  Left Colon: Grade 1- (portion of mucosa of the colon segment seen, but other areas of the colon segment are not well seen because of staining, residual  stool, and/or opaque liquid)  A careful inspection was made as the colonoscope was withdrawn. A retroflexed view of the rectum was performed; findings and interventions are described below. The patient was recovered in the GI recovery area.     Scope Times:     Insertion Time: 1331  Cecum Time: (DID NOT GO TO CECUM.)  Exit Time:  1338      Findings:  Normal perineum and anus on DRE. Poor prep, liquid and semisolid stool of left colon. At the splenic flexure, solid stool encountered. Procedure aborted. No obvious masses or polyps of left colon within limitations of exam.    Intervention(s):      None      Complication(s):     none  EBL: <1 ml    Impression(s):     Poor prep. Procedure aborted.    Recommendation(s):   Reschedule procedure. Though she was prescribed extended prep, patient reports she only began having bowel movements on morning of scheduled colonoscopy. I favor repeating extended prep, and continuing with golytely/mag citrate until clear.     Marijo Fileanya (Sahay) Celine AhrBruckel, MD

## 2018-09-06 NOTE — Discharge Instructions (Signed)
GASTROENTEROLOGY UNIT  DISCHARGE INSTRUCTIONS FOR GASTROSCOPY      09/06/2018                1:54 PM    EGD and Biopsies taken     Do not drive, operate heavy machinery, drink alcoholic beverages, make important personal or business decisions, or sign legal documents until the next day.    Return to your usual medications   Return to your usual diet    Things You May Expect:   A mild sore throat (Warm liquids or lozenges will soothe the throat)   You were given medication to help you relax during the test. You need to rest at home for at least 4-6 hours.    You Should Call Your Doctor For Any of the Following:   Fever, abdominal or chest pain that does not go away   Cough or trouble breathing   Bloody or black stools.   Pain or redness at the IV site    If you have a serious problem after hours.  Call [phone] to reach the GI physician on call.    If you are unable to reach your doctor, [facility].    Follow Up Care:   If biopsies were taken during your procedure, we will send you the pathology results within 7-10 days. If you do not receive your pathology results after 10 days please call [phone]   Follow-up in GI clinic with Cristy Hiltsussell Weg, MD as scheduled.    Current Discharge Medication List      Gastroenterology Unit  Discharge Instructions for Colonoscopy      09/06/2018    1:54 PM    Sigmoidoscopy    Do not drive, operate heavy machinery, drink alcoholic beverages, make important personal or business decisions, or sign legal documents until the next day.      Return to your usual diet   Return to taking your usual medications    Things you may expect:   A small amount of bright red blood in your stool   It may be a few days before you have a bowel movement   You may have cramping, bloating, and feelings of "gas". These feelings should go away as you pass gas. If you still feel uncomfortable, walking around will help to pass the gas.   You were given medication  to help you relax during the test. You may feel "fuzzy" and drowsy. Go home and rest for at least 4-6 hours.    You should call your doctor for any of the following:   Bad stomach pain   Fever   Bright red bleeding or clots (This may happen up to 20 days after the test.)   Dizziness or weakness that gets worse or lasts up to 24 hours.   Pain or redness at the IV site    If you have a serious problem after hours, Call [phone] to reach the GI physician on call. If you are unable to reach your doctor, [facility].    Follow Up Care:   Follow-up in GI clinic with Cristy Hiltsussell Weg, MD as scheduled   Schedule for colonoscopy prep teaching class    Current Discharge Medication List

## 2018-09-06 NOTE — Procedures (Signed)
EGD Procedure Note   Date of Procedure: 09/06/18  Primary Care Provider: Hetty ElyEllis, Josephine, MD  Attending Physician: Francisca Decemberanya Sanii Kukla, MD  Fellow: none  Indication(s): Anemia  Weight loss    Medications: See colonoscopy note for medication totals      Endoscope: [id number]  Accessories:Biopsy forceps: Regular cold biopsy     Procedure Description: Full disclosure of risks were reviewed with patient as detailed on the consent form. The patient was placed in the left lateral decubitus position and monitored with continuous pulse oximetry, interval blood pressure monitoring, and direct observations.   After adequate sedation, the endoscope was carefully introduced into the oropharynx and passed in to the esophagus under direct visualization. The esophagus and GE junction were carefully examined, including a measurement of the Z-line at 34cm, GEJ at 34 cm and hiatus at 35 cm from the incisors. After advancement of the endoscope into the stomach, a careful examination was performed, including views of the antrum, incisura angularis, corpus and retroflexed views of the cardia and fundus.   The pylorus was then intubated without any difficulty and the endoscope was advanced to the second portion of the duodenum. Careful examination of the second portion of the duodenum and the bulb was then performed.  The stomach was then decompressed and the endoscope withdrawn.   Findings and intervention(s) are detailed below. The patient was recovered in the GI recovery area    Findings:     Esophagus:   Normal tubular esophagus without strictures, rings, or furrows   No esophagitis   Normal Z line contour      Stomach:   Tiny sliding hiatal hernia. No obvious Cameron's erosions.   No ulcers or masses.   Random biopsies taken of gastric body, antrum, and incisura to rule out H pylori      Duodenum/small bowel:   No ulcers or masses.   Random biopsies taken to rule out Celiac disease    Intervention(s):   Cytology/biopsy: Biopsies  taken of stomach   of duodenum    Complication(s):   none  EBL: <1 ml    Impression(s):   Tiny sliding hiatal hernia. No Cameron's lesions noted.   Otherwise normal exam.   Gastric and duodenal biopsies taken.    Recommendation(s):   Await pathology results   Follow-up in GI clinic with Cristy Hiltsussell Weg, MD as scheduled   Proceed with colonoscopy as planned.      Francisca Decemberanya Karandeep Resende, MD

## 2018-09-12 ENCOUNTER — Other Ambulatory Visit: Payer: Self-pay | Admitting: Primary Care

## 2018-09-12 MED ORDER — LISINOPRIL 5 MG PO TABS *I*
5.0000 mg | ORAL_TABLET | Freq: Every day | ORAL | 1 refills | Status: DC
Start: 2018-09-12 — End: 2019-06-04

## 2018-09-12 MED ORDER — SERTRALINE HCL 50 MG PO TABS *I*
50.0000 mg | ORAL_TABLET | Freq: Every day | ORAL | 1 refills | Status: DC
Start: 2018-09-12 — End: 2018-12-08

## 2018-09-12 NOTE — Telephone Encounter (Signed)
Last appointment: 06/07/2018  Next appointment: Visit date not found    90 day supply requested.

## 2018-09-12 NOTE — Telephone Encounter (Signed)
In [place]      Last appointment: 06/07/2018  Next appointment: 02/05/2019

## 2018-09-12 NOTE — Telephone Encounter (Signed)
Pt needs a follow up appt for anxiety / depression , weight loss before medication can be filled. Than return to nurse for refill

## 2018-09-12 NOTE — Telephone Encounter (Signed)
2 in mm Last OV 06/07/18  Next appt 02/05/19 please ERX

## 2018-09-13 LAB — SURGICAL PATHOLOGY

## 2018-09-23 ENCOUNTER — Telehealth: Payer: Self-pay

## 2018-09-23 NOTE — Telephone Encounter (Signed)
-----   Message from [place] sent at 09/23/2018  3:59 PM EST -----    ----- Message -----  From: Odelia Gage, MD  Sent: 09/23/2018   3:11 PM EST  To: Mellissa Kohut,     Any update on this scheduling? This is my third message about this.     Thank you  ----- Message -----  From: Carlton Adam, LPN  Sent: 97/74/1423   3:57 PM EST  To: Francisca December, MD, Ricke Hey, #    Jasmine,    Please schedule on a Monday am for a colon class .    Thank you,  Inetta Fermo  ----- Message -----  From: Francisca December, MD  Sent: 09/06/2018   1:43 PM EST  To: Deatra Greenwich Cottone, LPN, Ricke Hey, #    She did extended prep but did not start moving bowels until AM of procedure  Very poor prep  Lets get her into colon prep teaching class  She will need repeat colonoscopy with extended prep, with plan to continue golytely/mag citrate until clear

## 2018-09-23 NOTE — Result Encounter Note (Signed)
Histopathology results from recent EGD are reviewed.  No significant/acute findings. Hp negative.  Follow up in GI clinic.    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   (B) Stomach, biopsy:   - Antral mucosa with mild chronic gastritis, inactive.   - Oxyntic mucosa with proton pump inhibitor treatment effect.   - No Helicobacter organisms identified on H&E-stained sections.

## 2018-09-23 NOTE — Telephone Encounter (Signed)
Called patient. Left vm advising patient to call back and schedule colon class on ac5 with GI med nurse. Mailed letter and sent my chart message.

## 2018-10-02 HISTORY — PX: EYE SURGERY: SHX253

## 2018-10-23 ENCOUNTER — Telehealth: Payer: Medicare (Managed Care) | Admitting: Primary Care

## 2018-10-23 NOTE — Telephone Encounter (Signed)
Received paperwork requesting preop clearance for cataract surgery.   Pt is in [place]. I am not able to clear for surgery since I have not seen pt since sept 2019,

## 2018-10-23 NOTE — Telephone Encounter (Signed)
Left Vm letting Megan ( surgery counselor) know unable to give surgical clearance cause pt hasn't been seen since 05/2018

## 2018-10-24 NOTE — Telephone Encounter (Signed)
Left another VM at surgical center and also faxed paper work back. Sent pt a My chart message too

## 2018-10-24 NOTE — Telephone Encounter (Signed)
[facility] called to let us know that they did receive the paperwork not clearing pt for this surgery FYI

## 2018-10-29 ENCOUNTER — Telehealth: Payer: Self-pay

## 2018-10-29 NOTE — Telephone Encounter (Signed)
-----   Message from [facility] sent at 09/19/2018  9:09 AM EST -----    ----- Message -----  From: Odelia Gage, MD  Sent: 09/19/2018   8:40 AM EST  To: Francisca December, MD, Ricke Hey    [facility],    Has this colonoscopy been rescheduled? If not, can we please do so ASAP? Again, pt will need extended prep with golytely until clear and golytely once daily for 2 weeks prior to procedure.     Thank you    Thank you  ----- Message -----  From: Carlton Adam, LPN  Sent: 12/45/8099   3:57 PM EST  To: Francisca December, MD, Ricke Hey, #    Jasmine,    Please schedule on a Monday am for a colon class .    Thank you,  Inetta Fermo  ----- Message -----  From: Francisca December, MD  Sent: 09/06/2018   1:43 PM EST  To: Deatra [place], LPN, Ricke Hey, #    She did extended prep but did not start moving bowels until AM of procedure  Very poor prep  Lets get her into colon prep teaching class  She will need repeat colonoscopy with extended prep, with plan to continue golytely/mag citrate until clear

## 2018-10-29 NOTE — Telephone Encounter (Signed)
Called patient again. Left another vm advising patient to call back and schedule colon class on ac5 asap. Mailed another letter and sent another my chart message.

## 2018-10-30 ENCOUNTER — Encounter: Payer: Self-pay | Admitting: Primary Care

## 2018-10-30 NOTE — Telephone Encounter (Signed)
From: Johniqua Nard  Sent: 10/30/2018 10:23 AM EST  To: [facility]  Subject: JK:DTOIZTIWP    I'm sorry I don't know who you are. I don't ever remember seeing you. My Dr is Dr Rennis Harding. This is eye surgery not sure why I would need any clearance.  ----- Message -----  From: Janit Bern, LPN   Sent: 8/0/9983 1:09 PM EST   To: [name]   Subject: clearance    Hello Terri   Just wanted to let you know we received paperwork for eye surgery from [place]. Since we haven't seen you since 05/2019 we are unable to clear you for this.   Thanks Jasmine December

## 2018-10-30 NOTE — Telephone Encounter (Signed)
From: Christeen Speece  Sent: 10/30/2018 10:23 AM EST  To: [facility]  Subject: RE:clearance    I'm sorry I don't know who you are. I don't ever remember seeing you. My Dr is Dr Ellis. This is eye surgery not sure why I would need any clearance.  ----- Message -----  From: Jahniah Pallas, LPN   Sent: 10/24/2018 1:09 PM EST   To: Merlene Soliday   Subject: clearance    Hello Terri   Just wanted to let you know we received paperwork for eye surgery from [place]. Since we haven't seen you since 05/2019 we are unable to clear you for this.   Thanks Carole Deere

## 2018-11-11 ENCOUNTER — Other Ambulatory Visit: Payer: Self-pay | Admitting: Primary Care

## 2018-11-11 MED ORDER — AMITRIPTYLINE HCL 25 MG PO TABS *I*
25.0000 mg | ORAL_TABLET | Freq: Every evening | ORAL | 1 refills | Status: AC
Start: 2018-11-11 — End: 2019-05-10

## 2018-11-11 NOTE — Telephone Encounter (Signed)
Last appointment: 06/07/2018  Next appointment: 02/05/2019        Brianna Barnett

## 2018-11-11 NOTE — Telephone Encounter (Signed)
Last appointment: 06/07/2018  Next appointment: 02/05/2019

## 2018-11-11 NOTE — Telephone Encounter (Signed)
Last appointment: 06/07/2018  Next appointment: 02/05/2019      Mm updated

## 2018-11-20 ENCOUNTER — Telehealth: Payer: Self-pay

## 2018-11-20 NOTE — Telephone Encounter (Signed)
-----   Message from Odelia Gage, MD sent at 10/29/2018  4:29 PM EST -----  Okay please mail letter if not done so already. This is an important colonoscopy for the pt. Please call again next week if no answer.     Thank you so much for your help!  ----- Message -----  From: Anthoney Harada  Sent: 10/29/2018   1:54 PM EST  To: Odelia Gage, MD    Hello. I have reached out to the patient several times to get her scheduled, but no response. Thanks   ----- Message -----  From: Ricke Hey  Sent: 09/19/2018   9:09 AM EST  To: Guadalupe Maple Kristian Covey      ----- Message -----  From: Odelia Gage, MD  Sent: 09/19/2018   8:40 AM EST  To: Francisca December, MD, Ricke Hey    [facility],    Has this colonoscopy been rescheduled? If not, can we please do so ASAP? Again, pt will need extended prep with golytely until clear and golytely once daily for 2 weeks prior to procedure.     Thank you    Thank you  ----- Message -----  From: Carlton Adam, LPN  Sent: 04/88/8916   3:57 PM EST  To: Francisca December, MD, Ricke Hey, #    Jasmine,    Please schedule on a Monday am for a colon class .    Thank you,  Inetta Fermo  ----- Message -----  From: Francisca December, MD  Sent: 09/06/2018   1:43 PM EST  To: Deatra [place], LPN, Ricke Hey, #    She did extended prep but did not start moving bowels until AM of procedure  Very poor prep  Lets get her into colon prep teaching class  She will need repeat colonoscopy with extended prep, with plan to continue golytely/mag citrate until clear

## 2018-11-20 NOTE — Telephone Encounter (Signed)
Called patient again. Left another vm advising patient to call back and schedule colon class on ac5 asap. Mailed another letter and sent another my chart message. Please WARM transfer.

## 2018-12-04 ENCOUNTER — Encounter: Payer: Self-pay | Admitting: Pulmonology

## 2018-12-08 ENCOUNTER — Other Ambulatory Visit: Payer: Self-pay | Admitting: Primary Care

## 2019-01-07 ENCOUNTER — Telehealth: Payer: Self-pay | Admitting: Primary Care

## 2019-01-07 NOTE — Telephone Encounter (Addendum)
LMTCO to change her 02/05/19 to a VV visit / for Subsequent Medicare Wellness visit & chronic conditions.

## 2019-01-10 NOTE — Telephone Encounter (Signed)
>  LMTCO to change visit to VV / Subsequent Medicare Wellness visit & chronic conditions.)

## 2019-01-14 ENCOUNTER — Telehealth: Payer: Self-pay | Admitting: Primary Care

## 2019-01-14 NOTE — Telephone Encounter (Signed)
Appt in place

## 2019-01-14 NOTE — Telephone Encounter (Signed)
appt in place

## 2019-01-14 NOTE — Telephone Encounter (Signed)
VV (link) 5/11 @[phone] / Subsequent Medicare Wellness visit & chronic conditions.

## 2019-01-27 ENCOUNTER — Telehealth: Payer: Self-pay | Admitting: Primary Care

## 2019-01-27 ENCOUNTER — Encounter: Payer: Self-pay | Admitting: Primary Care

## 2019-01-27 ENCOUNTER — Ambulatory Visit: Payer: Medicare (Managed Care) | Admitting: Primary Care

## 2019-01-27 VITALS — BP 131/78

## 2019-01-27 DIAGNOSIS — R1010 Upper abdominal pain, unspecified: Secondary | ICD-10-CM

## 2019-01-27 DIAGNOSIS — R739 Hyperglycemia, unspecified: Secondary | ICD-10-CM

## 2019-01-27 DIAGNOSIS — E782 Mixed hyperlipidemia: Secondary | ICD-10-CM

## 2019-01-27 DIAGNOSIS — G4700 Insomnia, unspecified: Secondary | ICD-10-CM

## 2019-01-27 DIAGNOSIS — F419 Anxiety disorder, unspecified: Secondary | ICD-10-CM

## 2019-01-27 DIAGNOSIS — I10 Essential (primary) hypertension: Secondary | ICD-10-CM

## 2019-01-27 DIAGNOSIS — Z0000 Encounter for general adult medical examination without abnormal findings: Secondary | ICD-10-CM

## 2019-01-27 DIAGNOSIS — F329 Major depressive disorder, single episode, unspecified: Secondary | ICD-10-CM

## 2019-01-27 DIAGNOSIS — R05 Cough: Secondary | ICD-10-CM

## 2019-01-27 DIAGNOSIS — R059 Cough, unspecified: Secondary | ICD-10-CM

## 2019-01-27 DIAGNOSIS — F32A Depression, unspecified: Secondary | ICD-10-CM

## 2019-01-27 MED ORDER — SERTRALINE HCL 50 MG PO TABS *I*
50.0000 mg | ORAL_TABLET | Freq: Every day | ORAL | 1 refills | Status: DC
Start: 2019-01-27 — End: 2019-05-02

## 2019-01-27 MED ORDER — FAMOTIDINE 20 MG PO TABS *I*
20.0000 mg | ORAL_TABLET | Freq: Two times a day (BID) | ORAL | 5 refills | Status: DC
Start: 2019-01-27 — End: 2019-04-28

## 2019-01-27 NOTE — Progress Notes (Signed)
Due to pandemic event, telehome visit preformed via:             Video/Zoom     Location of Patient: home  Location of Telemedicine Provider: home office   Other participants in telemedicine encounter and roles: n/a    Consent was obtained from the patient to complete this video visit; including the potential for financial liability.    This visit was performed during a pandemic event and the vital signs including BMI and physical exam are limited or missing due to the patient's location.  _____________________________________________________________________    Today we reviewed and updated Brianna Barnett smoking status, activities of daily living, depression screen, fall risk, medications and allergies.   I have counseled the patient in the above areas.     Subjective:     Chief Complaint: Brianna Barnett is a 68 y.o. female here for a/an Subsequent Annual Medicare Visit    In general, Brianna Barnett rates their overall health as:  good      Patient Care Team:  Hetty Ely, MD as PCP - Corinna Capra, MD     Current Outpatient Medications on File Prior to Visit   Medication Sig Dispense Refill    Ferrous Sulfate 324 (65 Fe) MG TBEC Take 1 tablet by mouth daily      amitriptyline (ELAVIL) 25 MG tablet Take 1 tablet (25 mg total) by mouth nightly 90 tablet 1    lisinopril (PRINIVIL,ZESTRIL) 5 MG tablet Take 1 tablet (5 mg total) by mouth daily 90 tablet 1    omeprazole (PRILOSEC) 20 MG capsule Take 1 capsule (20 mg total) by mouth 2 times daily (before meals) (Patient taking differently: Take 20 mg by mouth daily (before breakfast) ) 60 capsule 5    umeclidinium-vilanterol (ANORO ELLIPTA) 62.5-25 MCG/INH inhaler Inhale 1 puff into the lungs daily 1 Inhaler 11    FLOVENT HFA 220 MCG/ACT inhaler       meloxicam (MOBIC) 15 MG tablet Take 1 tablet (15 mg total) by mouth daily 30 tablet 5    fluticasone (FLONASE) 50 MCG/ACT nasal spray 1 spray by Nasal route daily 16 g 5    albuterol  HFA 108 (90 Base) MCG/ACT inhaler Inhale 2 puffs into the lungs       No current facility-administered medications on file prior to visit.      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 06/23/2016    Other pulmonary insufficiency, not elsewhere classified 06/06/2016    GERD (gastroesophageal reflux disease)     Arthritis      Past Medical History:   Diagnosis Date    Arthritis     Asthma     COPD (chronic obstructive pulmonary disease)  GERD (gastroesophageal reflux disease)     History of positive PPD, 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    Incontinence of urine     Other pulmonary insufficiency, not elsewhere classified 06/06/2016     Past Surgical History:   Procedure Laterality Date    ANKLE SURGERY  2013    Fracture on the right crush injury on the left    DENTAL SURGERY      esophageal dilatation      EYE SURGERY Left 10/02/2018    FOOT SURGERY Left 2011    KNEE SURGERY Left     3 times    LUNG REMOVAL, PARTIAL      PR THORACOSCOPY W/THERA WEDGE RESEXN INITIAL UNILAT Right 06/16/2016    Procedure: THORACOSCOPY (VATS) Wedge;  Surgeon: Barrett HenleJones, Carolyn, MD;  [facility];  Service: Other    TONSILLECTOMY      TUBAL LIGATION      pp btl @ umbilicus     Family History   Problem Relation Age of Onset    Heart Disease Mother     GERD Mother     Arthritis Mother      COPD Mother     Alzheimer's disease Mother     Osteoporosis Mother     No Known Problems Brother      Social History     Socioeconomic History    Marital status: Divorced     Spouse name: Not on file    Number of children: Not on file    Years of education: Not on file    Highest education level: Not on file   Occupational History    Not on file   Tobacco Use    Smoking status: Former Smoker     Packs/day: 0.75     Years: 37.00     Pack years: 27.75     Types: Cigarettes     Last attempt to quit: 09/19/1999     Years since quitting: 19.3    Smokeless tobacco: Never Used   Substance and Sexual Activity    Alcohol use: Yes     Frequency: 2-4 times a month     Drinks per session: 1 or 2     Binge frequency: Never    Drug use: No    Sexual activity: Never   Social History Narrative    Not on file       Objective:     Vital Signs: BP 131/78 Comment: per pt   BMI: There is no height or weight on file to calculate BMI.    Vision Screening Results (Welcome visit only):  No exam data present    Depression Screening Results:  Recent Review Flowsheet Data     PHQ Scores 01/27/2019 05/30/2018 03/04/2018 01/21/2018    PSQ2 Q1 - Interest/Pleasure N Y Y Y    PSQ2 Q2 - Down, Depressed, Hopeless N Y Y Y    PHQ Q9 - Better Off Dead - 0 0 0    PHQ Calculated Score - 18 13 12     PHQ Manual Score - - - 12        Opioid Use/DAST- 10 Screening Results:   How many times in the past year have you used an illegal drug or used a prescription medication for nonmedical reasons?: 0 (01/27/2019 10:11 AM)    Activities of Daily Living/Functional Screening Results:  Is the person deaf or does he/she have serious difficulty hearing?: N  *Hearing Status: No impairment  Is this  person blind or does he/she have serious difficulty seeing even when wearing glasses?: N  *Vision Status: Visual aid   Does this person have serious difficulty walking or climbing stairs?: Y  *Walks in Home: Independent  *Climbing Stairs: Needs Assistance  Does this  person have difficulty dressing or bathing?: N  *Shopping: Independent  *House Keeping: Independent  *Managing Own Medications: Independent  *Handling Finances: Independent  Difficulty doing errands due to a physicial, mental or emotional condition: No  Difficulty remembering or making decisions due to a physicial, mental or emotional condition: No      Fall Risk Screening Results:  Have you fallen in the last year?: No  Did you sustain an injury which required medical attention?: Yes  What level of medical attention?: ED  Do you feel you are at risk for falling?: No  Do you feel your risk of falling is: : Moderate  Would you like to learn more about how to reduce your risk of falling?: Yes      Assessment and Plan:      Cognitive Function:  Recall of recent and remote events appears:  Normal      Advanced Care Planning:  was discussed and the paperwork can be found in the scanned media section     The following health maintenance plan was reviewed with the patient:    Health Maintenance Topics with due status: Overdue       Topic Date Due    Colon Cancer Screening Other 1950/09/30    IMM-ZOSTER 05/03/2015    IMM-PNEUMOVAX VACCINE 65 + YRS 03/07/2016    DEPRESSION SCREEN MONTHLY 06/29/2018    Breast Cancer Screening Other 06/29/2018     Health Maintenance Topics with due status: Not Due       Topic Last Completion Date    Fall Risk Screening 06/13/2018    IMM-INFLUENZA Not Due     Health Maintenance Topics with due status: Completed       Topic Last Completion Date    IMM-PREVNAR VACCINE 65 + YRS 03/08/2015    HIV TESTING OFFERED 06/29/2017    HEPATITIS C SCREENING OFFERED 06/29/2017     This health maintenance schedule, identified risks, a list of orders placed today and patient goals have been provided to [facility] in the after visit summary.     Plan for any concerns identified during screening or risk assessments:  n/a

## 2019-01-27 NOTE — Patient Instructions (Signed)
Thank you for completing your Subsequent Annual Medicare Visit   with [place] today.     The purpose of this visits was to:     Screen for disease   Assess risk of future medical problems   Help develop a healthy lifestyle   Update vaccines   Get to know your doctor in case of an illness    Patient Care Team:  Hetty Ely, MD as PCP - General  Hetty Ely, MD     Medicare 5 Year Plan    The following items were identified as areas of concern during your screening today:  None identified - This is great news. You have avoided Smoking, Diabetes, High Blood Pressure (Hypertension) and a high BMI (you are not overweight).       The Health Maintenance table below identifies screening tests and immunizations recommended by your health care team:  Health Maintenance   Topic Date Due    Colon Cancer Screening Other  07-13-1951    IMM-ZOSTER (2 of 3) 05/03/2015    IMM-PNEUMOVAX VACCINE 65 + YRS (1) 03/07/2016    DEPRESSION SCREEN MONTHLY  06/29/2018    Breast Cancer Screening Other  06/29/2018    IMM-INFLUENZA (Season Ended) 05/20/2019    Fall Risk Screening  06/14/2019    HIV TESTING OFFERED  Completed    IMM-PREVNAR VACCINE 65 + YRS  Completed    HEPATITIS C SCREENING OFFERED  Completed     In addition, goals and orders placed to address these recommendations are listed in the "Today's Visit" section.    We wish you the best of health and look forward to seeing you again next year for your Annual Medicare Wellness Visit.     If you have any health care concerns before then, please do not hesitate to contact us.

## 2019-01-27 NOTE — Progress Notes (Signed)
Due to pandemic event, visit performed via:        Video   Location of Patient: home  Location of Telemedicine Provider: home office   Other participants in telemedicine encounter and roles: n/a    Consent was obtained from the patient to complete this video visit; including the potential for financial liability.    This visit was performed during a pandemic event and the vital signs including BMI and physical exam are limited or missing due to the patient's location.      Consent was obtained and visit was locked.  aware that visit is happening over secure networks and that she is not being recorded and that she can decline the visit at any time    [facility]     HISTORY OF PRESENT ILLNESS     Brianna Barnett is a 68 y.o. female who presents for Subsequent Annual Medicare Visit    Also seen for follow-up of the following chronic medical problems  Hypertension-states home blood pressures are good  Cough-much improved.  Has seen pulmonology and a new inhaler was added.  EGD was largely negative but colonoscopy needs to be repeated  Anxiety and depression-well-controlled  Hyperglycemia-no new concerns  Insomnia-doing well on current medication  Hyperlipidemia-no chest pain    Her abdominal pain she says is much better than it was before but is lasting longer even if it is less frequent.  She points to the upper abdomen as the area of pain.  She is not avoiding foods that are irritants for GERD but she is eating less of them      Review of Systems   Constitutional: Negative for chills, fever and weight loss.   Respiratory: Negative for cough, shortness of breath and wheezing.    Cardiovascular: Negative for chest pain and palpitations.   Gastrointestinal: Positive for abdominal pain and heartburn. Negative for constipation and diarrhea.       MEDICATIONS     Current Outpatient Medications   Medication Sig    Ferrous Sulfate 324 (65 Fe) MG TBEC Take 1 tablet by mouth daily    sertraline (ZOLOFT) 50 MG  tablet Take 1 tablet (50 mg total) by mouth daily    amitriptyline (ELAVIL) 25 MG tablet Take 1 tablet (25 mg total) by mouth nightly    lisinopril (PRINIVIL,ZESTRIL) 5 MG tablet Take 1 tablet (5 mg total) by mouth daily    omeprazole (PRILOSEC) 20 MG capsule Take 1 capsule (20 mg total) by mouth 2 times daily (before meals) (Patient taking differently: Take 20 mg by mouth daily (before breakfast) )    umeclidinium-vilanterol (ANORO ELLIPTA) 62.5-25 MCG/INH inhaler Inhale 1 puff into the lungs daily    FLOVENT HFA 220 MCG/ACT inhaler     famotidine (PEPCID) 20 MG tablet Take 1 tablet (20 mg total) by mouth 2 times daily    meloxicam (MOBIC) 15 MG tablet Take 1 tablet (15 mg total) by mouth daily    fluticasone (FLONASE) 50 MCG/ACT nasal spray 1 spray by Nasal route daily    albuterol HFA 108 (90 Base) MCG/ACT inhaler Inhale 2 puffs into the lungs       Patient's medications were reviewed and updated at today's visit and no changes were made    ALLERGIES     Cat dander; Sulfa antibiotics; Coconut; and Pneumovax 23  [pneumococcal vac polyvalent]      Patient's allergies were reviewed and updated at today's visit and no changes were made  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    Cramps of left lower extremity R25.2    Hyperglycemia R73.9    Hyperlipidemia E78.5    Insomnia G47.00    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    Mitral valve stenosis I05.0    Headache R51    Elevated blood pressure reading without diagnosis of hypertension R03.0    Incontinence of urine R32         TOBACCO USE HISTORY     Social History     Tobacco Use   Smoking Status Former Smoker    Packs/day: 0.75    Years: 37.00    Pack years: 27.75    Types: Cigarettes    Last attempt to quit: 09/19/1999    Years since quitting:  19.3   Smokeless Tobacco Never Used       PHYSICAL EXAM     BP 131/78 Comment: per pt    General Appearance: No distress    Physical Exam   Constitutional:   Pleasant lady in no distress   HENT:   Head: Normocephalic and atraumatic.   Right Ear: External ear normal.   Left Ear: External ear normal.   Nose: Nose normal.   Eyes: Right eye exhibits no discharge. Left eye exhibits no discharge. No scleral icterus.   Pulmonary/Chest: Effort normal. No respiratory distress.   Abdominal:             RECENT LABS   No results found for this or any previous visit (from the past 336 hour(s)).         ASSESSMENT/PLAN     1. Preventative health care  Up-to-date on routine preventative care except for mammogram immunizations and repeat colonoscopy    2. Essential hypertension  Continue to follow a low-salt diet.  Home blood pressures are at goal.  Continue to work on weight loss.  Recommend exercise 30 minutes most days of the week.  Continue lisinopril 5 mg a day.  Blood work to be done prior to next visit  - CBC; Future  - Comprehensive metabolic panel; Future  - Lipid Panel (Reflex to Direct  LDL if Triglycerides more than 400); Future  - Urinalysis with reflex to culture; Future    3. Cough  Appears to be improving.  Continue to monitor.  Reviewed notes from pulmonology and GI    4. Anxiety and depression  Well-controlled on current dose of Zoloft.  Continue to monitor.  As good social support.  Not suicidal.  Stay active    5. Hyperglycemia  Follow a low carbohydrate diet and exercise 30 minutes most days of the week or at least as much as possible.    Continue to work on weight loss.  Blood work to be done prior to next visit  - Hemoglobin A1c; Future    6. Insomnia, unspecified type  Continue current dose of amitriptyline    7. Mixed hyperlipidemia  Follow a low-cholesterol diet and exercise 30 minutes most days of the week or at least as much as possible.  Continue to work on weight loss.  Blood work to be done prior to  next visit.    8. Pain of upper abdomen  Suspect that her vital hernia and GERD is  playing a role.  Continue omeprazole as prescribed.  Added and Pepcid.  Risks benefits side effects reviewed with patient.  Reassess in 4 weeks or sooner as needed.  Avoid fried, fatty or spicy or acidic foods.  Avoid Caffeinated and carbonated drinks.  Call if no better or with any worsening.  Don't eat late at night.  Don't eat 3 hours prior to bedtime and drink all liquids 2 hours prior to bedtime.  Take medication as directed.  Avoid NSAIDs.  Sleep with the head end of the bed up 30.    Return in about 4 weeks (around 02/24/2019).    --Patient instructed to call if symptoms are not improving or worsening  --Follow-up arranged    Signed: Hetty Ely, MD on 01/27/2019 at 10:36 AM

## 2019-01-27 NOTE — Telephone Encounter (Signed)
VV (link) Needed @[phone]/ 1 month for abd pain

## 2019-01-28 ENCOUNTER — Ambulatory Visit: Payer: Medicare (Managed Care) | Admitting: Pulmonology

## 2019-01-28 ENCOUNTER — Other Ambulatory Visit: Payer: Medicare (Managed Care)

## 2019-02-05 ENCOUNTER — Ambulatory Visit: Payer: Medicare (Managed Care) | Admitting: Primary Care

## 2019-02-19 ENCOUNTER — Encounter: Payer: Self-pay | Admitting: Gastroenterology

## 2019-02-27 ENCOUNTER — Ambulatory Visit: Payer: Medicare (Managed Care) | Admitting: Primary Care

## 2019-03-18 ENCOUNTER — Telehealth: Payer: Self-pay

## 2019-03-18 DIAGNOSIS — Z008 Encounter for other general examination: Secondary | ICD-10-CM

## 2019-03-18 NOTE — Telephone Encounter (Signed)
Writer left VM for Terri regarding covid screening prior to appointment on 7/7. Information provided for [facility] for screening between 7/2 - 7/5. Faythe Dingwall, RN

## 2019-03-20 ENCOUNTER — Telehealth: Payer: Self-pay | Admitting: Pulmonology

## 2019-03-20 NOTE — Telephone Encounter (Signed)
Patient called to request that her upcoming Pulmonary appointment be rescheduled to August 2020 as she will be in [place],  Probation officer rescheduled appointment to 05/06/2019. Patient requested MyChart message with appointment information. MyChart message sent.

## 2019-03-20 NOTE — Telephone Encounter (Unsigned)
Copied from [place]. Topic: Appointments - Reschedule Appointment  >> Mar 20, 2019  1:37 PM [employer] wrote:  Ms. Fullenwider is calling to cancel her appointment which is currently scheduled for 03/25/19 a 8:30am SPI and 9am FUV Dr.Levy.    Reason for the cancellation: Patient stated she is still in [place]    Has the appointment been cancelled? Cancelled by [facility] staff    Has the appointment been rescheduled? No, [facility] staff not able to reschedule for [facility] location    Date of new appointment? N/A     Patient stated she will be back from [place] on 04/02/19 then has to quarantine for 2 weeks. She stated to reschedule for August.     Does the patient need a call back to reschedule?yes/MyChart message    Patient can be contacted back at [phone]    *Writer called backline and was advised by Freddy Jaksch that she would send Mychart message to patient with rescheduled appt update.

## 2019-03-25 ENCOUNTER — Other Ambulatory Visit: Payer: Medicare (Managed Care)

## 2019-03-25 ENCOUNTER — Ambulatory Visit: Payer: Medicare (Managed Care) | Admitting: Pulmonology

## 2019-04-26 ENCOUNTER — Encounter: Payer: Self-pay | Admitting: Pulmonology

## 2019-04-28 ENCOUNTER — Other Ambulatory Visit: Payer: Self-pay | Admitting: Primary Care

## 2019-04-28 MED ORDER — FAMOTIDINE 20 MG PO TABS *I*
20.0000 mg | ORAL_TABLET | Freq: Two times a day (BID) | ORAL | 5 refills | Status: DC
Start: 2019-04-28 — End: 2019-04-29

## 2019-04-28 NOTE — Telephone Encounter (Signed)
Last appointment: 06/07/2018  Next appointment: 05/02/2019

## 2019-04-28 NOTE — Telephone Encounter (Signed)
Last appointment: 06/07/2018, telemed 01/27/19  Next appointment: 05/02/2019      Mm updated

## 2019-04-29 ENCOUNTER — Other Ambulatory Visit: Payer: Self-pay | Admitting: Primary Care

## 2019-04-29 ENCOUNTER — Telehealth: Payer: Self-pay

## 2019-04-29 DIAGNOSIS — Z008 Encounter for other general examination: Secondary | ICD-10-CM

## 2019-04-29 MED ORDER — FAMOTIDINE 20 MG PO TABS *I*
20.0000 mg | ORAL_TABLET | Freq: Two times a day (BID) | ORAL | 5 refills | Status: AC
Start: 2019-04-29 — End: 2021-02-18

## 2019-04-29 NOTE — Telephone Encounter (Signed)
Writer attempted to contact patient regarding Covid screen prior to appointment on 05/06/19.  No answer.  Voicemail message as well as My Chart letter sent to patient.  Information provided for [place] to be screened between 05/01/19 through 05/04/19.  Instructed to call clinic if unable to be screened so appointment can be rescheduled.Marland KitchenMarland KitchenMetro Kung, RN

## 2019-04-29 NOTE — Telephone Encounter (Signed)
Last appointment: 06/07/2018  Next appointment: 05/02/2019

## 2019-04-29 NOTE — Telephone Encounter (Signed)
MM updated last VV 01/27/19, next OV 05/02/19

## 2019-05-02 ENCOUNTER — Telehealth: Payer: Self-pay | Admitting: Primary Care

## 2019-05-02 ENCOUNTER — Encounter: Payer: Self-pay | Admitting: Primary Care

## 2019-05-02 ENCOUNTER — Ambulatory Visit: Payer: Medicare (Managed Care) | Admitting: Primary Care

## 2019-05-02 ENCOUNTER — Other Ambulatory Visit
Admission: RE | Admit: 2019-05-02 | Discharge: 2019-05-02 | Disposition: A | Discharge status: Home / Self Care | Payer: Medicare (Managed Care) | Source: Ambulatory Visit | Attending: Pulmonology | Admitting: Pulmonology

## 2019-05-02 DIAGNOSIS — F419 Anxiety disorder, unspecified: Secondary | ICD-10-CM

## 2019-05-02 DIAGNOSIS — R109 Unspecified abdominal pain: Secondary | ICD-10-CM

## 2019-05-02 DIAGNOSIS — E782 Mixed hyperlipidemia: Secondary | ICD-10-CM

## 2019-05-02 DIAGNOSIS — Z1211 Encounter for screening for malignant neoplasm of colon: Secondary | ICD-10-CM

## 2019-05-02 DIAGNOSIS — Z20828 Contact with and (suspected) exposure to other viral communicable diseases: Secondary | ICD-10-CM | POA: Insufficient documentation

## 2019-05-02 DIAGNOSIS — M8580 Other specified disorders of bone density and structure, unspecified site: Secondary | ICD-10-CM

## 2019-05-02 DIAGNOSIS — F32A Depression, unspecified: Secondary | ICD-10-CM

## 2019-05-02 DIAGNOSIS — G4700 Insomnia, unspecified: Secondary | ICD-10-CM

## 2019-05-02 DIAGNOSIS — I10 Essential (primary) hypertension: Secondary | ICD-10-CM

## 2019-05-02 DIAGNOSIS — Z008 Encounter for other general examination: Secondary | ICD-10-CM | POA: Insufficient documentation

## 2019-05-02 DIAGNOSIS — F329 Major depressive disorder, single episode, unspecified: Secondary | ICD-10-CM

## 2019-05-02 LAB — COVID-19 NAAT (PCR): COVID-19 NAAT (PCR): NEGATIVE

## 2019-05-02 LAB — COVID-19 PCR

## 2019-05-02 MED ORDER — SERTRALINE HCL 50 MG PO TABS *I*
50.0000 mg | ORAL_TABLET | Freq: Every day | ORAL | 1 refills | Status: DC
Start: 2019-05-02 — End: 2019-09-17

## 2019-05-02 NOTE — Progress Notes (Signed)
Video Visit     Location of Patient: home    Location of Telemedicine Provider: clinical office    Other participants in telemedicine encounter and roles:  n/a    This is an established patient visit.    Reason for visit: Hypertension; Anxiety; Depression; and Abdominal Pain      HPI  Patient seen for follow-up of the following chronic medical problems  Hypertension-well-controlled  Anxiety and depression-well-controlled  Hyperlipidemia-no chest pain  Insomnia-improved  Osteopenia-due for bone density  Patient also never heard back about the colonoscopy that she was supposed to do.  Prep was inadequate in December and was supposed to be rescheduled and it never happened.  She is requesting a different provider today.  Also complaining of abdominal pain that comes and goes.  She had an EGD in the past and everything was normal.  At times she has a left-sided abdominal pain and a lot of acid and she gets nauseous and throws up.  Sometimes she is unable to keep the food down.  She has been taking Pepcid and omeprazole.  She follows a GERD diet.  She feels fine this week but was not so good last week.  She has an upcoming appointment with pulmonology to discuss her ongoing cough and other lung issues    ROS  Review of Systems   Constitutional: Negative for chills, fever and weight loss.   Respiratory: Positive for cough. Negative for shortness of breath and wheezing.    Cardiovascular: Negative for chest pain and palpitations.   Gastrointestinal: Positive for abdominal pain, nausea and vomiting.   Psychiatric/Behavioral: Negative for depression. The patient is not nervous/anxious.        Patient's problem list, allergies, and medications were reviewed and updated as appropriate.  Please see the EHR for full details.    Exam and data reviewed:  Physical Exam   Constitutional:   Pleasant lady who appears to be in no distress   HENT:   Head: Normocephalic and atraumatic.   Right Ear: External ear normal.   Left Ear:  External ear normal.   Nose: Nose normal.   Eyes: Right eye exhibits no discharge. Left eye exhibits no discharge. No scleral icterus.   Pulmonary/Chest: Effort normal. No respiratory distress.   Psychiatric: Affect normal. Her mood appears not anxious. She does not exhibit a depressed mood. She expresses no homicidal and no suicidal ideation. She expresses no suicidal plans and no homicidal plans.       Assessment & Plan:  1. Essential hypertension  Continue lisinopril 5 mg a day.  Follow low-salt diet and exercise 30 minutes most days of the week.  Continue to work on weight loss.  Goal blood pressure 130/90 or less.  Do blood work as ordered.    2. Anxiety and depression  Continue sertraline at current dosage.  Has good social support.  Not suicidal.  Stay active.  Reassess in 6 months or sooner as needed    3. Mixed hyperlipidemia  Do blood work as ordered.  Follow low-cholesterol diet and exercise 30 minutes most days of the week.  Continue to work on weight loss.    4. Insomnia, unspecified type  Continue amitriptyline at current dosage.    5. Osteopenia  Continue calcium and vitamin D daily.  Continue weightbearing exercises 2-3 times per week.  Do bone density as ordered  - DEXA Scan; Future    6. Colon cancer screening  See referral to GI for colonoscopy  -  AMB REFERRAL TO GASTROENTEROLOGY    7. Unspecified abdominal pain  Avoid fried, fatty or spicy or acidic foods.  Avoid Caffeinated and carbonated drinks.  Call if no better or with any worsening.  Don't eat late at night.  Don't eat 3 hours prior to bedtime and drink all liquids 2 hours prior to bedtime.  Take medication as directed.  Avoid NSAIDs.  Sleep with the head end of the bed up 30.  Continue omeprazole and Pepcid.  If symptoms do not resolve or worsen she will need to follow-up with GI as she may need another EGD for evaluation for H pylori.  Last EGD showed tiny sliding hiatal hernia-done in December 2019.  Biopsy done previously showed  duodenal mucosa with gastric foveolar metaplasia changes consistent with peptic injury and antral mucosa with mild chronic gastritis and active as well as oxyntic mucosa with PPI treatment effect    Consent was obtained from the patient to complete this video visit; including the potential for financial liability.          Hetty ElyJOSEPHINE Skylur Fuston, MD

## 2019-05-02 NOTE — Progress Notes (Signed)
Still having bad stomach pain Did have testing in the past. Having touble keeping food down A lot of the pain is on the left side A lot of acid in the morning and gets dry heave. This comes and goes

## 2019-05-02 NOTE — Telephone Encounter (Signed)
Check out Complete / process    REFERRAL TO GASTROENTEROLOGY Michaela Corner, MD)   Address: [address] [address]   Phone: [phone]

## 2019-05-06 ENCOUNTER — Ambulatory Visit: Payer: Medicare (Managed Care) | Attending: Pulmonology | Admitting: Pulmonology

## 2019-05-06 ENCOUNTER — Ambulatory Visit: Payer: Medicare (Managed Care)

## 2019-05-06 ENCOUNTER — Encounter: Payer: Self-pay | Admitting: Pulmonology

## 2019-05-06 VITALS — BP 137/79 | HR 91 | Temp 97.3°F | Resp 18 | Ht 62.76 in | Wt 177.4 lb

## 2019-05-06 DIAGNOSIS — R05 Cough: Secondary | ICD-10-CM | POA: Insufficient documentation

## 2019-05-06 DIAGNOSIS — K219 Gastro-esophageal reflux disease without esophagitis: Secondary | ICD-10-CM

## 2019-05-06 DIAGNOSIS — R059 Cough, unspecified: Secondary | ICD-10-CM

## 2019-05-06 DIAGNOSIS — J449 Chronic obstructive pulmonary disease, unspecified: Secondary | ICD-10-CM | POA: Insufficient documentation

## 2019-05-06 MED ORDER — FLUTICASONE-SALMETEROL 115-21 MCG/ACT IN AERO *I*
2.0000 | INHALATION_SPRAY | Freq: Two times a day (BID) | RESPIRATORY_TRACT | 11 refills | Status: AC
Start: 2019-05-06 — End: ?

## 2019-05-06 NOTE — Patient Instructions (Addendum)
Begin new inhaler 2 puffs with 2 breaths twice a day    Use proventil--albuterol as needed    Follow up visit in person before you leave for [facility]. In September  Ideally set up visit with spirometry.      Save your Anoro just in case we go back to using it later.

## 2019-05-12 NOTE — Progress Notes (Signed)
Patient:  Brianna Barnett  MRN:  [id number]    Pulmonary Clinic Note:   Brianna Barnett, M.D.    Dear Dr. Lissa Merlin:    Brianna Barnett was interviewed and examined in the Pulmonary Clinic on May 06, 2019 in follow-up for her obstructive lung disease and shortness of breath.  Since last being seen she has remained relatively stable but does report ongoing problems with dyspnea on exertion.  She thought the inhaler helped a little bit and if she doesnt take it had worsening symptoms.  Humid weather is a real problem.  She has her good days and bad days.  She has had no setbacks requiring antibiotics, steroids or emergency room visits.    Recently she has had some up and down abdominal pain and plans to have a repeat upper endoscopy soon.  The pain is in the upper abdomen subxiphoid region.  This can grab her at any virtually anytime and she just has to ride it out at home.  She has some nausea and loss of appetite with it.    Her medication list was reviewed and updated.  She has been on Anoro one inhalation daily and she uses as needed Albuterol at the rate of about one to two inhalations each week.  Otherwise her medications are listed in the electronic record.    Review of systems otherwise negative.  She did not report any significant cough, sputum or hemoptysis.  No edema.  The anorexia is as noted above.      On my exam her blood pressure measured 137/79 with a resting heart rate of 91 and an oxygen saturation of 95% on room air.  Her weight today was 177 pounds.  JVP was not elevated.  Cardiac tones regular.  I could not hear a murmur of aortic valve disease.  Lungs were pretty clear without rales or rhonchi for the most part no wheezing.  She may have had a hint of some fine crackles at the right lung base.    Her spirometry has deteriorated.  The FEV1 measured 1.3 liters at 65% of predicted and her vital capacity measured 2.2 liters at 88% of predicted.  The FEV1/FVC ratio was significantly reduced at 59%.   These studies are consistent with obstructive lung disease.  Compared to her studies last September her FEV1 has fallen by 14%.    In summary Brianna Barnett has ongoing symptoms of dyspnea on exertion, nocturnal cough as well as the recent upper abdominal pain.  I dont think we are dealing with cardiac disease but should keep that possibility in mind.  I switched her over to Fluticasone plus Salmeterol (Advair) two puffs twice a day as well as needed Albuterol.  She will hold onto her Anoro should we have to go back to it.  Ill see her back before she goes to [place] in late September.  I would like to repeat her PFTs and see if we can improve upon things with the addition of an inhaled steroid.  Her effort during todays PFTs shows a little bit of hesitancy but I do sense that her lung mechanics have fallen a bit over the past year.    In September we will provide her a flu shot if she has not already had one.  If she remains symptomatic I will double check for additional cardiac symptoms but at the present time she really does not have anginal equivalent type symptoms of diaphoresis, chest pain or arm/neck pain.    Sincerely,  Hall Busing. [place] Poli, M.D.

## 2019-06-03 ENCOUNTER — Ambulatory Visit: Payer: Medicare (Managed Care) | Admitting: Pulmonology

## 2019-06-03 ENCOUNTER — Telehealth: Payer: Self-pay

## 2019-06-03 ENCOUNTER — Ambulatory Visit
Admission: AD | Admit: 2019-06-03 | Discharge: 2019-06-03 | Disposition: A | Discharge status: Home / Self Care | Payer: Medicare (Managed Care) | Source: Ambulatory Visit | Attending: Emergency Medicine | Admitting: Emergency Medicine

## 2019-06-03 DIAGNOSIS — Z20828 Contact with and (suspected) exposure to other viral communicable diseases: Secondary | ICD-10-CM | POA: Insufficient documentation

## 2019-06-03 DIAGNOSIS — Z01812 Encounter for preprocedural laboratory examination: Secondary | ICD-10-CM | POA: Insufficient documentation

## 2019-06-03 DIAGNOSIS — R0600 Dyspnea, unspecified: Secondary | ICD-10-CM

## 2019-06-03 DIAGNOSIS — R05 Cough: Secondary | ICD-10-CM

## 2019-06-03 LAB — COVID-19 NAAT (PCR): COVID-19 NAAT (PCR): NEGATIVE

## 2019-06-03 LAB — COVID-19 PCR

## 2019-06-03 NOTE — ED Triage Notes (Signed)
Patient presenting to Urgent Care for testing only. COVID-19 test ordered by    NP swab obtained and sent for analysis.    Triage Note   Brianna Maxham, LPN

## 2019-06-03 NOTE — Telephone Encounter (Signed)
Writer spoke with Terri regarding Covid screen prior to appointment on 09/16. Information provided for [facility] to be screened today.  Instructed to call clinic if unable to be screened so appointment can be rescheduled. Faythe Dingwall, RN

## 2019-06-04 ENCOUNTER — Encounter: Payer: Self-pay | Admitting: Pulmonology

## 2019-06-04 ENCOUNTER — Ambulatory Visit: Payer: Medicare (Managed Care) | Attending: Pulmonology | Admitting: Pulmonology

## 2019-06-04 ENCOUNTER — Ambulatory Visit: Payer: Medicare (Managed Care)

## 2019-06-04 ENCOUNTER — Other Ambulatory Visit
Admission: RE | Admit: 2019-06-04 | Discharge: 2019-06-04 | Disposition: A | Discharge status: Home / Self Care | Payer: Medicare (Managed Care) | Source: Ambulatory Visit

## 2019-06-04 VITALS — BP 145/86 | HR 102 | Temp 96.6°F | Resp 16 | Ht 62.52 in | Wt 179.9 lb

## 2019-06-04 DIAGNOSIS — R739 Hyperglycemia, unspecified: Secondary | ICD-10-CM | POA: Insufficient documentation

## 2019-06-04 DIAGNOSIS — R059 Cough, unspecified: Secondary | ICD-10-CM

## 2019-06-04 DIAGNOSIS — R0600 Dyspnea, unspecified: Secondary | ICD-10-CM | POA: Insufficient documentation

## 2019-06-04 DIAGNOSIS — K219 Gastro-esophageal reflux disease without esophagitis: Secondary | ICD-10-CM | POA: Insufficient documentation

## 2019-06-04 DIAGNOSIS — R0609 Other forms of dyspnea: Secondary | ICD-10-CM

## 2019-06-04 DIAGNOSIS — R05 Cough: Secondary | ICD-10-CM | POA: Insufficient documentation

## 2019-06-04 DIAGNOSIS — I10 Essential (primary) hypertension: Secondary | ICD-10-CM | POA: Insufficient documentation

## 2019-06-04 LAB — LIPID PANEL
Chol/HDL Ratio: 2.7
Cholesterol: 203 mg/dL — AB
HDL: 76 mg/dL — ABNORMAL HIGH (ref 40–60)
LDL Calculated: 110 mg/dL
Non HDL Cholesterol: 127 mg/dL
Triglycerides: 84 mg/dL

## 2019-06-04 LAB — TSH: TSH: 2.67 u[IU]/mL (ref 0.27–4.20)

## 2019-06-04 LAB — URINALYSIS REFLEX TO CULTURE
Blood,UA: NEGATIVE
Glucose,UA: NEGATIVE mg/dL
Ketones, UA: NEGATIVE
Leuk Esterase,UA: NEGATIVE
Nitrite,UA: NEGATIVE
Protein,UA: NEGATIVE mg/dL
Specific Gravity,UA: 1.014 (ref 1.002–1.030)
pH,UA: 5.5 (ref 5.0–8.0)

## 2019-06-04 LAB — COMPREHENSIVE METABOLIC PANEL
ALT: 27 U/L (ref 0–35)
AST: 32 U/L (ref 0–35)
Albumin: 4.2 g/dL (ref 3.5–5.2)
Alk Phos: 115 U/L — ABNORMAL HIGH (ref 35–105)
Anion Gap: 10 (ref 7–16)
Bilirubin,Total: 0.2 mg/dL (ref 0.0–1.2)
CO2: 27 mmol/L (ref 20–28)
Calcium: 9.6 mg/dL (ref 8.6–10.2)
Chloride: 105 mmol/L (ref 96–108)
Creatinine: 0.74 mg/dL (ref 0.51–0.95)
GFR,Black: 95 *
GFR,Caucasian: 83 *
Glucose: 86 mg/dL (ref 60–99)
Lab: 8 mg/dL (ref 6–20)
Potassium: 4.4 mmol/L (ref 3.3–5.1)
Sodium: 142 mmol/L (ref 133–145)
Total Protein: 6.9 g/dL (ref 6.3–7.7)

## 2019-06-04 LAB — TIBC
Iron: 48 ug/dL (ref 34–165)
TIBC: 344 ug/dL (ref 250–450)
Transferrin Saturation: 14 % — ABNORMAL LOW (ref 15–50)

## 2019-06-04 LAB — CBC
Hematocrit: 41 % (ref 34–45)
Hemoglobin: 13.1 g/dL (ref 11.2–15.7)
MCH: 27 pg/cell (ref 26–32)
MCHC: 32 g/dL (ref 32–36)
MCV: 86 fL (ref 79–95)
Platelets: 321 10*3/uL (ref 160–370)
RBC: 4.8 MIL/uL (ref 3.9–5.2)
RDW: 14 % (ref 11.7–14.4)
WBC: 7.9 10*3/uL (ref 4.0–10.0)

## 2019-06-04 LAB — FERRITIN: Ferritin: 42 ng/mL (ref 10–120)

## 2019-06-04 MED ORDER — LOSARTAN POTASSIUM 25 MG PO TABS *I*
25.0000 mg | ORAL_TABLET | Freq: Every day | ORAL | 3 refills | Status: AC
Start: 2019-06-04 — End: ?

## 2019-06-04 NOTE — Progress Notes (Signed)
Telephone Visit     This is an established patient visit.    Location of Telemedicine Provider: hospital / clinical location    Reason for visit: No chief complaint on file.      HPI:  Inhaler has not helped much, still with cough and dyspnea.  No reports of sputum or hemoptysis.  Will be heading to [place] in 5 days and has no MD follow up down there.    Patient's problem list, allergies, and medications were reviewed and updated as appropriate. Please see the EHR for full details.    Physical Exam:    This visit was performed during a pandemic event, thus the physical exam was not performed.    Assessment Plan:  Discussed options of seeking help in [place] but she has no PCP to work with.    She really needs repeat PFTs to see if the prior worsening inproved at all.  Agrees to COVID testing, PFTS and a face to face visit before weekend.    The plan was discussed with the patient and the patient/patient rep demonstrated understanding to the provider's satisfaction.    Consent was previously obtained from the patient to complete this telephone consult; including the potential for financial liability.    11-20 minutes were spent on the phone with the patient, patient representatives, and/or other attendees.       Lamount Cohen, MD

## 2019-06-04 NOTE — Patient Instructions (Addendum)
Follow up in June 2021    Your breathing tests improved on the inhaler and should continue.    Continue to use Advair inhaler 2 puffs twice a day.     Stop lisinopril    Begin losartan 25 mg daily    Get BP checked in the next few weeks by provider or at area pharmacy while in [place]

## 2019-06-05 ENCOUNTER — Telehealth: Payer: Self-pay | Admitting: Primary Care

## 2019-06-05 LAB — HEMOGLOBIN A1C: Hemoglobin A1C: 5.6 %

## 2019-06-05 NOTE — Telephone Encounter (Signed)
Mychart message sent.

## 2019-06-05 NOTE — Telephone Encounter (Signed)
-----   Message from Earna Coder, MD sent at 06/05/2019  9:36 AM EDT -----  Please notify patient that blood work looks good except for cholesterol being elevated.  Was she fasting 8 hours prior to blood work?  Rest of the labs look okay

## 2019-06-09 NOTE — Progress Notes (Signed)
Patient:  Brianna Barnett  MRN:  [id number]    Pulmonary Clinic Note:   Earna Coder, M.D.    Dear Dr. Lissa Merlin:    Brianna Barnett was interviewed and examined in the Pulmonary Clinic on June 04, 2019 because of ongoing problems with cough and shortness of breath.  When last seen she was placed on Advair MDI two puffs twice a day using the 115-21 dose.  She wasnt really clear that it had helped at all.  She wasnt really clear that it had helped at all.  She has had a lot of cough.  Sometimes it is very barky and can cause her to be short of breath.  It occurs pretty much throughout the day and on occasion can awake her at night.  She does take Omeprazole once a day in the morning and she also takes Pepcid in the evening.  Very rarely does she use antacids.  No sinus congestion or post-nasal drip type symptoms.    Her medication list was reviewed and updated.  She has been on the Advair MDI for about 4-5 weeks.  Other medications are as listed.  Review of systems negative for edema or rash.  No sore throat from the inhaler.  She has been on Lisinopril for about a year and she also states that her cough has been seemingly worse for about a year.    On my exam her blood pressure measured 145/86 with a resting heart rate of 102 and an oxygen saturation of 98% on room air.  Her weight today was 180 pounds.  Her lungs were very clear.  No wheezing.  No crackles.  JVP did not appear to be elevated but hard to see.  No edema or rash.    Spirometry is actually a little bit better than before with about a 14% improvement in her FEV1.  This improved from 1.3 liters to 1.49 liters.  Her vital capacity also improved from 2.2 liters to 2.3 liters.  Those changes are probably within the variation in the test.  Most importantly, her previous pattern with a FEV1/FVC ratio that was reduced has now improved with a FEV1/FVC of 65%.  This implied some resolution of the obstructive physiology.    In summary Brianna Barnett  still has a barky cough and on occasion shortness of breath.  She hasnt been very engaged in fitness at all.  It is curious that she has been on Lisinopril for about a year and also describes the cough as she does.  Ive asked her to stop her Lisinopril since an ACE inhibitor might be aggravating things.  Ive switched her over to an ARB, Losartan 25 mg daily.  She will need to check her blood pressure in [place] when she leaves.  Ive also ordered blood work to double check her kidney function and follow-up on some of her other prior abnormalities.  Ive also included thyroid function and iron studies and a hemoglobin given her history of iron deficient anemia.  She did have a recent endoscopy late last year.    With respect to her airways disease Ive encouraged her to stay on the Advair for the time being.  She will also use Albuterol as needed.  It does appear that the Advair MDI reversed some of her obstructive physiology and should be beneficial.  Its encouraging that her DLCO remains solidly normal at 81% of predicted.      Ill see her back in late spring.    Sincerely,  Hall Busing. [place] Poli, M.D.

## 2019-07-28 ENCOUNTER — Other Ambulatory Visit: Payer: Self-pay | Admitting: Primary Care

## 2019-07-28 NOTE — Telephone Encounter (Signed)
MM updated, pt last VV 05/02/19, next OV 09/03/19

## 2019-09-03 ENCOUNTER — Telehealth: Payer: Self-pay | Admitting: Primary Care

## 2019-09-03 ENCOUNTER — Ambulatory Visit: Payer: Medicare (Managed Care) | Admitting: Primary Care

## 2019-09-03 ENCOUNTER — Telehealth: Payer: Medicare (Managed Care) | Admitting: Primary Care

## 2019-09-03 NOTE — Telephone Encounter (Signed)
Mailed Letter as requested

## 2019-09-03 NOTE — Telephone Encounter (Signed)
-----   Message from Earna Coder, MD sent at 09/03/2019  1:11 PM EST -----  Regarding: no show  No showed for OV today.please send letter    JE

## 2019-09-15 ENCOUNTER — Other Ambulatory Visit: Payer: Self-pay | Admitting: Primary Care

## 2019-09-15 MED ORDER — AMITRIPTYLINE HCL 25 MG PO TABS *I*
25.0000 mg | ORAL_TABLET | Freq: Every evening | ORAL | 1 refills | Status: DC
Start: 2019-09-15 — End: 2020-03-12

## 2019-09-15 NOTE — Telephone Encounter (Signed)
Last appointment: Visit date not found  Next appointment: 02/09/2020    Last Visit VV on 05/02/19 with JE

## 2019-09-17 ENCOUNTER — Other Ambulatory Visit: Payer: Self-pay | Admitting: Primary Care

## 2019-09-17 NOTE — Telephone Encounter (Signed)
Scheduled : 5//24/2021.   Last OV : 05/02/2019

## 2019-09-19 HISTORY — PX: CHOLECYSTECTOMY: SHX55

## 2019-10-03 ENCOUNTER — Telehealth: Payer: Self-pay | Admitting: Pulmonology

## 2019-10-03 NOTE — Telephone Encounter (Signed)
Copied from CRM [id number]. Topic: Access to Care - Medical Records Request  >> Oct 03, 2019 12:26 PM Lauree Chandler wrote:  .Joann with the office of [facility] did not receive the fax from this morning please refax to [phone] . Fax number verified.    Chyrl Civatte can be reached at [phone] if needed

## 2019-10-03 NOTE — Telephone Encounter (Signed)
Refaxed 06/04/2019 office note & PFT report to Attn: Joann, to the fax number provided.

## 2019-10-03 NOTE — Telephone Encounter (Signed)
Copied from CRM [id number]. Topic: Access to Care - Medical Records Request  >> Oct 03, 2019 10:09 AM Andria Frames wrote:  Chyrl Civatte with the office of [facility], is requesting office notes or records from the patient's visit on 06/04/2019 , with Dr. Darcella Cheshire. Reason for record is patient is having gallbladder surgery in [place] She would like the information to be faxed to [phone] [facility]. If necessary, Chyrl Civatte can be reached at [phone].   Marland Kitchen

## 2019-10-03 NOTE — Telephone Encounter (Signed)
Faxed 06/04/2019 office note & PFT report to Attn: Joann, to the fax number provided.

## 2019-12-07 ENCOUNTER — Other Ambulatory Visit: Payer: Self-pay | Admitting: Pulmonary Disease

## 2019-12-07 DIAGNOSIS — Z23 Encounter for immunization: Secondary | ICD-10-CM

## 2020-02-03 ENCOUNTER — Telehealth: Payer: Self-pay

## 2020-02-03 DIAGNOSIS — Z01812 Encounter for preprocedural laboratory examination: Secondary | ICD-10-CM

## 2020-02-03 NOTE — Telephone Encounter (Signed)
Writer left a vm for Brianna Barnett regarding Covid screen prior to appointment on 5/25. Information provided for [place] to be screened between 5/20 through 5/21.  Instructed to call clinic if unable to be screened so appointment can be rescheduled. [name], RN

## 2020-02-05 ENCOUNTER — Telehealth: Payer: Self-pay | Admitting: Pulmonology

## 2020-02-05 NOTE — Telephone Encounter (Signed)
Copied from CRM [id number]. Topic: Appointments - Appointment Information  >> Feb 05, 2020 12:04 PM Brianna Barnett wrote:  Brianna Barnett is calling to cancel her appointment which is currently scheduled for 5/25.    Reason for the cancellation: Patient is in [place]    Has the appointment been cancelled? yes    Has the appointment been rescheduled? yes    Date of new appointment?7/6     Does the patient need a call back to reschedule?no    Patient can be contacted back at [phone] (home)

## 2020-02-09 ENCOUNTER — Ambulatory Visit: Payer: Medicare (Managed Care) | Admitting: Primary Care

## 2020-02-10 ENCOUNTER — Ambulatory Visit: Payer: Medicare (Managed Care) | Admitting: Pulmonology

## 2020-02-10 ENCOUNTER — Other Ambulatory Visit: Payer: Medicare (Managed Care)

## 2020-03-12 ENCOUNTER — Ambulatory Visit: Payer: Medicare (Managed Care) | Admitting: Primary Care

## 2020-03-12 ENCOUNTER — Other Ambulatory Visit: Payer: Self-pay | Admitting: Primary Care

## 2020-03-12 NOTE — Telephone Encounter (Addendum)
Last appointment: 04/2019  Next appointment: 05/17/20           call to pt, she is in [facility]. Scheduled her for Physical per her travel plans this summer.   Only in [place] 8-05/2020

## 2020-03-15 ENCOUNTER — Telehealth: Payer: Self-pay

## 2020-03-15 DIAGNOSIS — Z01812 Encounter for preprocedural laboratory examination: Secondary | ICD-10-CM

## 2020-03-15 NOTE — Telephone Encounter (Signed)
Writer left a vm for Terri regarding Covid screen prior to appointment on 7/6. Instructed to either bring proof of vaccination or have a covid screen within 5 days of appointment. Information provided for [place] to be screened between 7/1 through 7/2.  Instructed to call clinic if unable to be screened so appointment can be rescheduled. [name], RN

## 2020-03-23 ENCOUNTER — Other Ambulatory Visit: Payer: Medicare (Managed Care)

## 2020-03-23 ENCOUNTER — Ambulatory Visit: Payer: Medicare (Managed Care) | Admitting: Pulmonology

## 2020-03-29 ENCOUNTER — Other Ambulatory Visit: Payer: Self-pay | Admitting: Primary Care

## 2020-03-29 NOTE — Telephone Encounter (Signed)
Last appointment: 04/2019  Next appointment: 05/17/2020

## 2020-05-17 ENCOUNTER — Ambulatory Visit: Payer: Medicare (Managed Care) | Admitting: Primary Care

## 2020-06-08 ENCOUNTER — Encounter: Payer: Self-pay | Admitting: Primary Care

## 2020-07-02 ENCOUNTER — Encounter: Payer: Self-pay | Admitting: Primary Care

## 2020-07-14 ENCOUNTER — Encounter: Payer: Self-pay | Admitting: Primary Care

## 2020-11-09 ENCOUNTER — Telehealth: Payer: Self-pay | Admitting: Primary Care

## 2020-11-09 NOTE — Telephone Encounter (Signed)
[employer] to schedule a medicare wellness visit

## 2021-02-03 ENCOUNTER — Telehealth: Payer: Self-pay | Admitting: Primary Care

## 2021-02-03 NOTE — Telephone Encounter (Signed)
[employer] to schedule MWV

## 2021-02-08 NOTE — Telephone Encounter (Signed)
Sent mychart message and left VM to call and schedule appt

## 2021-02-13 ENCOUNTER — Ambulatory Visit
Admission: AD | Admit: 2021-02-13 | Discharge: 2021-02-13 | Disposition: A | Discharge status: Home / Self Care | Payer: Medicare (Managed Care) | Source: Ambulatory Visit | Attending: Emergency Medicine | Admitting: Emergency Medicine

## 2021-02-13 DIAGNOSIS — J069 Acute upper respiratory infection, unspecified: Secondary | ICD-10-CM | POA: Insufficient documentation

## 2021-02-13 DIAGNOSIS — J40 Bronchitis, not specified as acute or chronic: Secondary | ICD-10-CM | POA: Insufficient documentation

## 2021-02-13 DIAGNOSIS — Z20822 Contact with and (suspected) exposure to covid-19: Secondary | ICD-10-CM | POA: Insufficient documentation

## 2021-02-13 LAB — COVID-19 NAAT (PCR): COVID-19 NAAT (PCR): NEGATIVE

## 2021-02-13 MED ORDER — BENZONATATE 100 MG PO CAPS *I*
100.0000 mg | ORAL_CAPSULE | Freq: Three times a day (TID) | ORAL | 0 refills | Status: DC | PRN
Start: 2021-02-13 — End: 2021-02-18

## 2021-02-13 MED ORDER — AZITHROMYCIN 250 MG PO TABS *I*
ORAL_TABLET | ORAL | 0 refills | Status: AC
Start: 2021-02-13 — End: 2021-02-18

## 2021-02-13 MED ORDER — PREDNISONE 20 MG PO TABS *I*
40.0000 mg | ORAL_TABLET | Freq: Every day | ORAL | 0 refills | Status: DC
Start: 2021-02-13 — End: 2021-02-18

## 2021-02-13 NOTE — UC Provider Note (Signed)
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    Incontinence of urine     Other pulmonary insufficiency, not elsewhere classified 06/06/2016        Patient Active Problem List   Diagnosis Code    Other pulmonary insufficiency, not elsewhere classified J98.4    GERD (gastroesophageal reflux disease) K21.9    Arthritis M19.90    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    Cramps of left lower extremity R25.2    Hyperglycemia R73.9    Hyperlipidemia E78.5    Insomnia G47.00    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    Mitral valve stenosis I05.0    Headache R51.9    Elevated blood pressure reading without diagnosis of hypertension R03.0    Incontinence of urine R32            Past Surgical History:   Procedure Laterality Date    ANKLE SURGERY  2013    Fracture on the right crush injury on the left    DENTAL SURGERY      esophageal dilatation      EYE SURGERY Left 10/02/2018    cataracts and astigmatism    FOOT SURGERY Left 2011    KNEE SURGERY Left     3 times    LUNG REMOVAL, PARTIAL      PR THORACOSCOPY W/THERA WEDGE RESEXN INITIAL UNILAT Right 06/16/2016    Procedure: THORACOSCOPY (VATS) Wedge;  Surgeon: Barrett Henle, MD;  Location: [facility] MAIN OR;  Service: Other    TONSILLECTOMY      TUBAL LIGATION      pp btl @ umbilicus     Family History   Problem Relation Age of Onset    Heart Disease Mother     GERD Mother     Arthritis Mother     COPD Mother     Alzheimer's disease Mother     Osteoporosis Mother     No Known Problems Brother           Social History     Tobacco Use    Smoking status: Former Smoker      Packs/day: 0.75     Years: 37.00     Pack years: 27.75     Types: Cigarettes     Quit date: 09/19/1999     Years since quitting: 21.4    Smokeless tobacco: Never Used   Substance Use Topics    Alcohol use: Yes    Drug use: No     Living Situation     Questions Responses    Patient lives with     Homeless     Caregiver for other family member     External Services     Employment     Domestic Violence Risk                 Review of Systems   Review of Systems   Constitutional:  Negative for chills and fever.   HENT: Positive for congestion and sore throat.    Respiratory: Positive for cough and shortness of breath.    Musculoskeletal: Positive for myalgias.       Physical Exam   Triage Vitals  Triage Start: Start, (02/13/21 1249)   First Recorded BP: 141/81, Resp: 20, Temp: 36 C (96.8 F), Temp src: TEMPORAL Oxygen Therapy SpO2: 93 %, Oximetry Source: Rt Hand, O2 Device: None (Room air), Heart Rate: 107, (02/13/21 1253) Heart Rate (via Pulse Ox): 107, (02/13/21 1253).  First Pain Reported  0-10 Scale: 10, Pain Location/Orientation: Head, (02/13/21 1253)       Physical Exam  Vitals and nursing note reviewed.   Constitutional:       General: She is not in acute distress.     Appearance: She is well-developed. She is not toxic-appearing.   HENT:      Head: Normocephalic.      Right Ear: Tympanic membrane and ear canal normal.      Left Ear: Tympanic membrane and ear canal normal.      Nose: Congestion present.      Mouth/Throat:      Mouth: Mucous membranes are moist.      Pharynx: Posterior oropharyngeal erythema (mild) present. No pharyngeal swelling or oropharyngeal exudate.      Tonsils: No tonsillar exudate.   Eyes:      Conjunctiva/sclera: Conjunctivae normal.      Pupils: Pupils are equal, round, and reactive to light.   Cardiovascular:      Rate and Rhythm: Regular rhythm. Tachycardia present.   Pulmonary:      Effort: Pulmonary effort is normal.      Breath sounds: Wheezing (scattered bases) and rhonchi (mild  bases) present. No rales.   Chest:      Chest wall: No tenderness.   Musculoskeletal:      Cervical back: Normal range of motion and neck supple.   Skin:     General: Skin is warm and dry.   Neurological:      Mental Status: She is alert.          Medical Decision Making        Medical Decision Making  Assessment:    Pt comes to uc for cough and congestion - hx of lung problems    Differential diagnosis:    [place] Peers - bronchitis  - pneumonia - covid - flu    Plan and Results:    Covid- flu test- home on zithromax -tessalon and prednisone - follow up with pmd and go to emergency if worsening        Diagnosis and Disposition:   Uri -bronchitis- covid testing- home    Return precautions discussed and provided on AVS.    Discharge Medications  Current Discharge Medication List    New Medications    azithromycin (ZITHROMAX) 250 mg tablet  for bronchitis Take 2 tablets (500 mg) on day 1, followed by 1 tablet (250 mg) on days 2-5.  Quantity 6 tablet Refill 0  Start date: 02/13/2021, End date: 02/18/2021  Comments: Emergency Encounter    predniSONE (DELTASONE) 40 mg  Dose: 40 mg  Take 40 mg by mouth daily   Quantity 10 tablet Refill 0  Start date: 02/13/2021, End date: 02/18/2021    benzonatate (TESSALON) 100 mg  Dose: 100 mg  Take 100 mg by mouth 3 times daily as needed for Cough   Swallow whole. Do not crush or chew.  Quantity 15 capsule Refill 0  Start date: 02/13/2021, End date: 02/18/2021            Follow-up      Patient Instructions  .      Covid-flu test done- results in 1-2 days- someone will call you if   positive- results also in My Chart - quarantine until results - follow up   with your doctor as needed   - zithromax -tessalon and prednisone as   prescribed - continue with inhaler as needed- follow up with your doctor              Final Diagnosis  Final diagnoses:   [Z20.822] Encounter for laboratory testing for COVID-19 virus (Primary)   [J40] Bronchitis   [J06.9] Upper respiratory tract infection, unspecified type          Perley Jain, MD              Marteze Vecchio, Jewel Baize, MD  02/13/21 1545

## 2021-02-13 NOTE — ED Triage Notes (Signed)
C/o cough, sore throat, HA, body aches and SOB. Symptoms started Friday. Has tried taking nyquil and delsym for cough. Patient is vaccinated for covid with booster.

## 2021-02-13 NOTE — ED Notes (Signed)
Did O2 on both hands, and both read 93%O2

## 2021-02-13 NOTE — Discharge Instructions (Addendum)
Covid-flu test done- results in 1-2 days- someone will call you if positive- results also in My Chart - quarantine until results - follow up with your doctor as needed   - zithromax -tessalon and prednisone as prescribed - continue with inhaler as needed- follow up with your doctor

## 2021-02-14 LAB — RSV PCR: RSV PCR: 0

## 2021-02-14 LAB — INFLUENZA B PCR: Influenza B PCR: 0

## 2021-02-14 LAB — INFLUENZA A: Influenza A PCR: 0

## 2021-02-15 ENCOUNTER — Telehealth: Payer: Self-pay | Admitting: Primary Care

## 2021-02-15 NOTE — Telephone Encounter (Signed)
May wish to try over-the-counter Robitussin-DM and suggest she have tracking follow-up with Dr. Rennis Harding when she returns tomorrow but if she strongly wishes cross cover evaluation can add as video visit end of day with me today

## 2021-02-15 NOTE — Telephone Encounter (Signed)
Can take 72 hrs for prednisone to work. Should call back if cough persists beyond 72 hrs. Can try robitussin OTC in addition to prednisone

## 2021-02-15 NOTE — Telephone Encounter (Signed)
Patient called stating that she went to urgent care over weekend for cold sx. All of her test results came back neg. Patient is asking if she can be prescribed something for the cough she have. She stated that it's really bad and gives her a headache. Urgent care did give her prednisone.          Please advise

## 2021-02-15 NOTE — Telephone Encounter (Signed)
Left vmmtco

## 2021-02-16 NOTE — Telephone Encounter (Signed)
Left detailed VM with advise from PCP

## 2021-02-18 ENCOUNTER — Encounter: Payer: Self-pay | Admitting: Primary Care

## 2021-02-18 ENCOUNTER — Ambulatory Visit: Payer: Medicare (Managed Care) | Admitting: Primary Care

## 2021-02-18 VITALS — BP 162/96 | HR 98 | Ht 62.5 in | Wt 172.0 lb

## 2021-02-18 DIAGNOSIS — R059 Cough, unspecified: Secondary | ICD-10-CM

## 2021-02-18 DIAGNOSIS — J4551 Severe persistent asthma with (acute) exacerbation: Secondary | ICD-10-CM

## 2021-02-18 MED ORDER — BENZONATATE 200 MG PO CAPS *I*
200.0000 mg | ORAL_CAPSULE | Freq: Three times a day (TID) | ORAL | 0 refills | Status: AC | PRN
Start: 2021-02-18 — End: 2021-02-28

## 2021-02-18 MED ORDER — NEBULIZER/TUBING/MOUTHPIECE KIT *A*
PACK | 0 refills | Status: AC
Start: 2021-02-18 — End: ?

## 2021-02-18 MED ORDER — PREDNISONE 10 MG PO TABS *I*
ORAL_TABLET | ORAL | 0 refills | Status: AC
Start: 2021-02-18 — End: 2021-02-26

## 2021-02-18 MED ORDER — HYDROCOD POLST-CPM POLST ER 10-8 MG/5ML PO SUER *I*
5.0000 mL | Freq: Two times a day (BID) | ORAL | 0 refills | Status: DC | PRN
Start: 2021-02-18 — End: 2021-02-24

## 2021-02-18 MED ORDER — ALBUTEROL SULFATE (2.5 MG/3ML) 0.083% IN NEBU *I*
2.5000 mg | INHALATION_SOLUTION | Freq: Once | RESPIRATORY_TRACT | Status: AC
Start: 2021-02-18 — End: 2021-02-18
  Administered 2021-02-18: 2.5 mg via RESPIRATORY_TRACT

## 2021-02-18 MED ORDER — ALBUTEROL SULFATE 1.25 MG/3ML IN NEBU *I*
1.0000 | INHALATION_SOLUTION | Freq: Four times a day (QID) | RESPIRATORY_TRACT | 1 refills | Status: AC | PRN
Start: 2021-02-18 — End: ?

## 2021-02-18 NOTE — Progress Notes (Signed)
[place] PRESENT ILLNESS     Brianna Barnett is a 70 y.o. female who presents for Cough and Follow-up    Comes in for acute visit with concerns of cough. Daughter joined her later in the visit.   Coughing for a week. Not bringing anything up, sold her house and is moving to [facility] for good. Is only here for a few days. Was seen in urgent care and treated with zpak, tessalon perles and prednisone for 5 days. Patient states that she did not take Zpak as she did not think it was needed. Does not feel like prednisone did anything. coughing a lot. Worried that it is going into her chest. Has a history of lung problems and is on advair daily along with anoro    Review of Systems   Constitutional: Negative for chills, fever and weight loss.   HENT: Negative for sore throat.    Respiratory: Positive for cough and wheezing. Negative for sputum production and shortness of breath.    Cardiovascular: Negative for chest pain.       MEDICATIONS     Current Outpatient Medications   Medication Sig    sertraline (ZOLOFT) 50 MG tablet TAKE 1 TABLET BY MOUTH EVERY DAY    amitriptyline (ELAVIL) 25 MG tablet TAKE 1 TABLET BY MOUTH EVERY DAY AT NIGHT    losartan (COZAAR) 25 MG tablet Take 1 tablet (25 mg total) by mouth daily    fluticasone-salmeterol (ADVAIR HFA) 115-21 MCG/ACT inhaler Inhale 2 puffs into the lungs 2 times daily Shake well before each use.    famotidine (PEPCID) 20 MG tablet Take 1 tablet (20 mg total) by mouth 2 times daily    Ferrous Sulfate 324 (65 Fe) MG TBEC Take 1 tablet by mouth daily    omeprazole (PRILOSEC) 20 MG capsule Take 1 capsule (20 mg total) by mouth 2 times daily (before meals) (Patient taking differently: Take 20 mg by mouth daily (before breakfast) )    umeclidinium-vilanterol (ANORO ELLIPTA) 62.5-25 MCG/INH inhaler Inhale 1 puff into the lungs daily    fluticasone (FLONASE) 50 MCG/ACT nasal spray 1 spray by Nasal route daily    benzonatate (TESSALON) 200 mg  capsule Take 1 capsule (200 mg total) by mouth 3 times daily as needed for Cough for up to 10 days   Swallow whole. Do not crush or chew.    HYDROcodone-chlorpheniramine polistirex (TUSSIONEX) 10-8 mg/52mL extended release suspension Take 5 mLs by mouth every 12 hours as needed for Cough for up to 7 days  for Cough Max daily dose: 10 mLs    predniSONE (DELTASONE) 10 mg tablet Take 4 tablets daily for 2 days, then 3 tablets daily for 2 days, then 2 tablets daily for 2 days, then 1 tablet daily for 2 days    nebulizer device with mask and tubing Use  3  times per day    albuterol (ACCUNEB) 1.25 mg/3 mL nebulizer solution Inhale 3 mLs (1.25 mg total) by nebulization every 6 hours as needed for Wheezing  for Spasm of Lung Air Passages    meloxicam (MOBIC) 15 MG tablet Take 1 tablet (15 mg total) by mouth daily (Patient not taking: Reported on 05/02/2019)    albuterol HFA 108 (90 Base) MCG/ACT inhaler Inhale 2 puffs into the lungs       Patient's medications were reviewed and updated at today's visit and no changes were made    ALLERGIES  Cat dander, Sulfa antibiotics, Coconut, and Pneumovax 23  [pneumococcal vac polyvalent]      Patient's allergies were reviewed and updated at today's visit and no changes were made    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    Cramps of left lower extremity R25.2    Hyperglycemia R73.9    Hyperlipidemia E78.5    Insomnia G47.00    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    Mitral valve stenosis I05.0    Headache R51.9    Elevated blood pressure reading without diagnosis of hypertension R03.0    Incontinence of urine R32         TOBACCO USE HISTORY     Social History     Tobacco Use   Smoking Status Former Smoker    Packs/day: 0.75    Years: 37.00    Pack years: 27.75     Types: Cigarettes    Quit date: 09/19/1999    Years since quitting: 21.4   Smokeless Tobacco Never Used       PHYSICAL EXAM     BP (!) 162/96    Pulse 98    Ht 1.588 m (5' 2.5")    Wt 78 kg (172 lb)    SpO2 97% Comment: after neb treatment   BMI 30.96 kg/m       Physical Exam  Vitals reviewed.   Constitutional:       Comments: Pleasant lady in no distress, wearing a mask   HENT:      Head: Normocephalic and atraumatic.      Right Ear: External ear normal.      Left Ear: External ear normal.      Nose: Nose normal.   Eyes:      Conjunctiva/sclera: Conjunctivae normal.   Cardiovascular:      Rate and Rhythm: Normal rate and regular rhythm.      Heart sounds: Normal heart sounds.   Pulmonary:      Effort: Pulmonary effort is normal. No respiratory distress.      Breath sounds: Wheezing present.      Comments: Wheezes in both lung fields. decreased breath sounds    After 1 breathing treatment, chest is less tight and air entry is improved but still with wheezing  Neurological:      Mental Status: She is alert.   Psychiatric:         Mood and Affect: Mood normal.           RECENT LABS     Recent Results (from the past 336 hour(s))   Influenza A PCR    Collection Time: 02/13/21 12:56 PM    Specimen: Nasopharyngeal   Result Value Ref Range    Influenza A PCR .    Influenza B PCR    Collection Time: 02/13/21 12:56 PM    Specimen: Nasopharyngeal   Result Value Ref Range    Influenza B PCR .    RSV PCR    Collection Time: 02/13/21 12:56 PM    Specimen: Nasopharyngeal   Result Value Ref Range    RSV PCR .    COVID-19 PCR    Collection Time: 02/13/21 12:56 PM   Result Value Ref Range    COVID-19 Source Nasopharyngeal  COVID-19 PCR NEGATIVE NEG            ASSESSMENT/PLAN     1. Cough  From airway obstruction. Recommend prednisone over 8 days. Tapering dose. Risks and benefits reviewed. Continue breathing treatment. Use albuterol nebulizer as prescribed.start  tussionex as prescribed.     2. Severe persistent reactive airway  disease with acute exacerbation  See above plan. Call if no better or with any worsening. Continue prednisone as prescribed. Will follow up with primary and pulmonology in [place]  - nebulizer device with mask and tubing; Use  3  times per day  Dispense: 1 kit; Refill: 0    Follow up if symptoms worsen or fail to improve.    --Patient instructed to call if symptoms are not improving or worsening  --Follow-up arranged    Signed: Earna Coder, MD on 02/19/2021 at 5:28 PM

## 2021-02-18 NOTE — Telephone Encounter (Signed)
appt in place

## 2021-02-18 NOTE — Progress Notes (Signed)
Using OTC medication cause the benzonatate wasn't available

## 2021-02-18 NOTE — Telephone Encounter (Signed)
Patient called back stating that she went to urgent care for the cough and they gave her prednisone. Patient stated that she's been taking robitussin as well. Patient stated that nothing seem to be helping. She said the cough is now really bad and gives her headaches.       Please advise

## 2021-02-18 NOTE — Telephone Encounter (Signed)
She should be seen for a visit.  She has not been seen since 2020.  Please offer any available openings today.  Would prefer an office visit but can be video visit if needed

## 2021-02-24 ENCOUNTER — Other Ambulatory Visit: Payer: Self-pay | Admitting: Primary Care

## 2021-02-24 NOTE — Telephone Encounter (Signed)
Last appointment: 02/18/2021  Next appointment: Visit date not found        Pt states med ran out and cough is still bad

## 2021-02-24 NOTE — Telephone Encounter (Signed)
Please advise if you will refill this PMP checked last disp 02/18/21 Last ov 02/18/21 Next appt please set up physical     This report was requested by: [facility]   Reference #: [id number]   [facility] Prescriptions  Patient Name: Modena Bellemare   Birth Date: 02-Jan-1951   Address: [address] [place], [place] [address]   Sex: Female   Rx Written Rx Dispensed Drug Quantity Days Supply Prescriber Name Prescriber Dea # Payment Method Dispenser   02/18/2021 02/18/2021 hydrocodone-chlorphen er susp  39ml 7 Hetty Ely MD [id number] Insurance [facility] [phone]     * - Drugs marked with an asterisk are compound drugs. If the compound drug is made up of more than one controlled substance, then each controlled substance will be a separate row in the table.

## 2021-03-01 MED ORDER — HYDROCOD POLST-CPM POLST ER 10-8 MG/5ML PO SUER *I*
5.0000 mL | Freq: Two times a day (BID) | ORAL | 0 refills | Status: AC | PRN
Start: 2021-03-01 — End: 2021-03-08

## 2021-03-01 NOTE — Telephone Encounter (Signed)
Needs to be seen if no better- can go to urgent care if she is no longer in urgent care

## 2021-03-28 IMAGING — MG MAMMOGRAPHY SCREENING BILATERAL 3[PERSON_NAME]
8 series · 8 of 24 positions shown · non-contrast
Comparison: No prior examinations were available for comparison.

FINAL Diagnostic Imaging Report 
________________________________________________________________________________________________ 
MAMMOGRAPHY SCREENING BILATERAL 3REJI KR SEROLF, 03/28/2021 [DATE]: 
CLINICAL INDICATION: Screening. Previous mammogram done 20 years ago and not 
available for comparison so this will serve as patients new baseline mammogram.
TECHNIQUE: Digital bilateral mammograms and 3-D Tomosynthesis were obtained. 
These were interpreted both primarily and with the aid of computer-aided 
detection system.  
DENSITY: (Level B) There are scattered areas of fibroglandular density.

[L MLO]
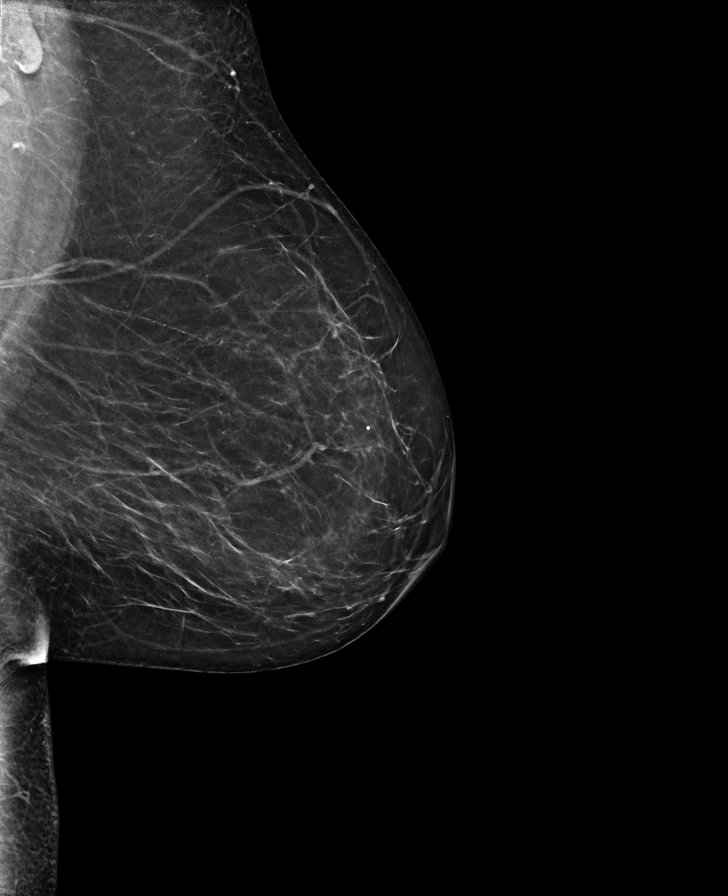

[R CC]
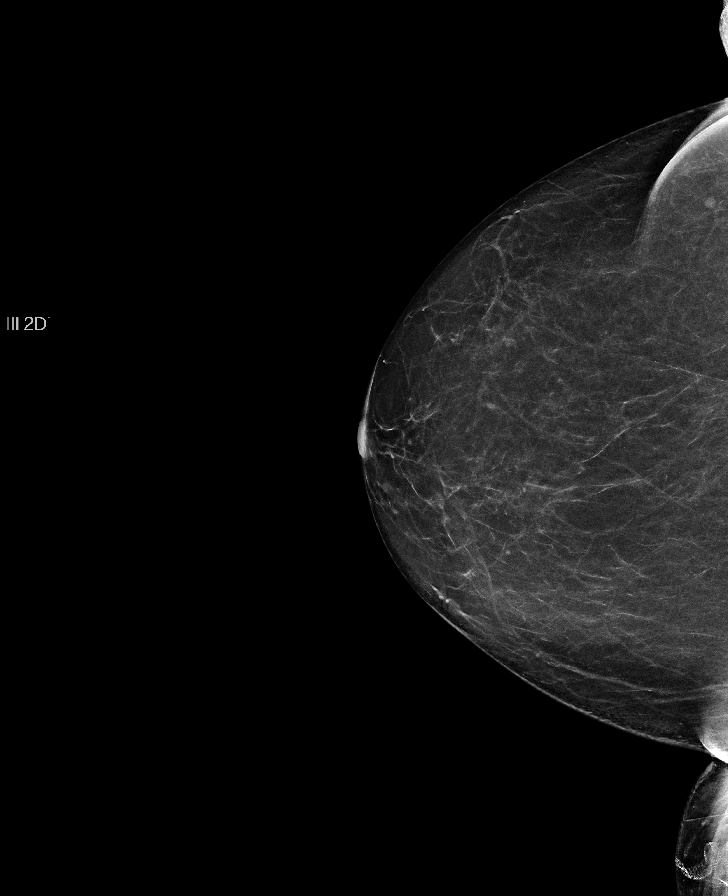

[R MLO]
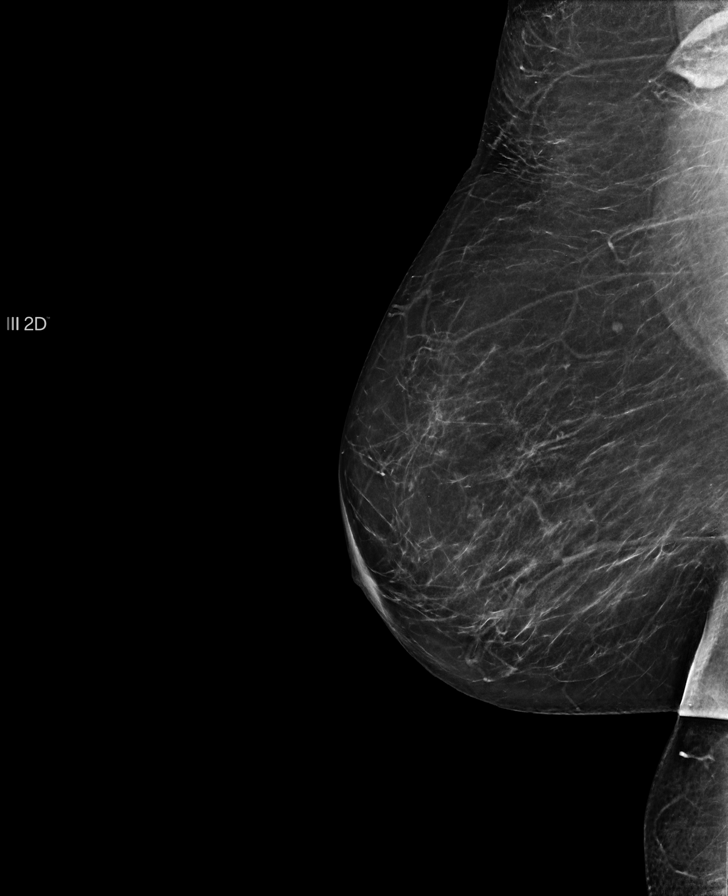

[L CC]
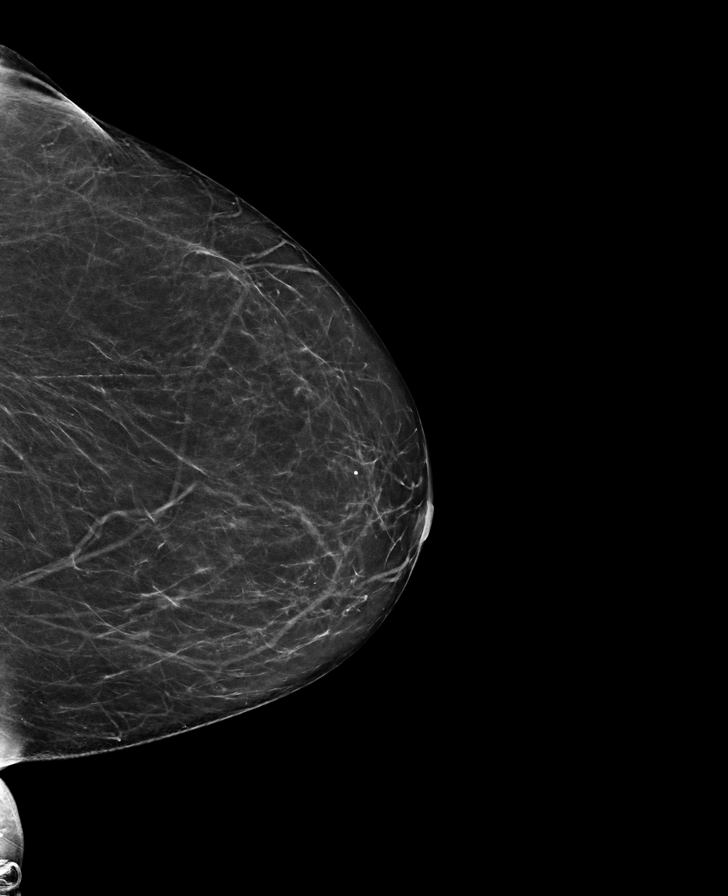

[L MLO tomo · tomo slice 33/66.0]
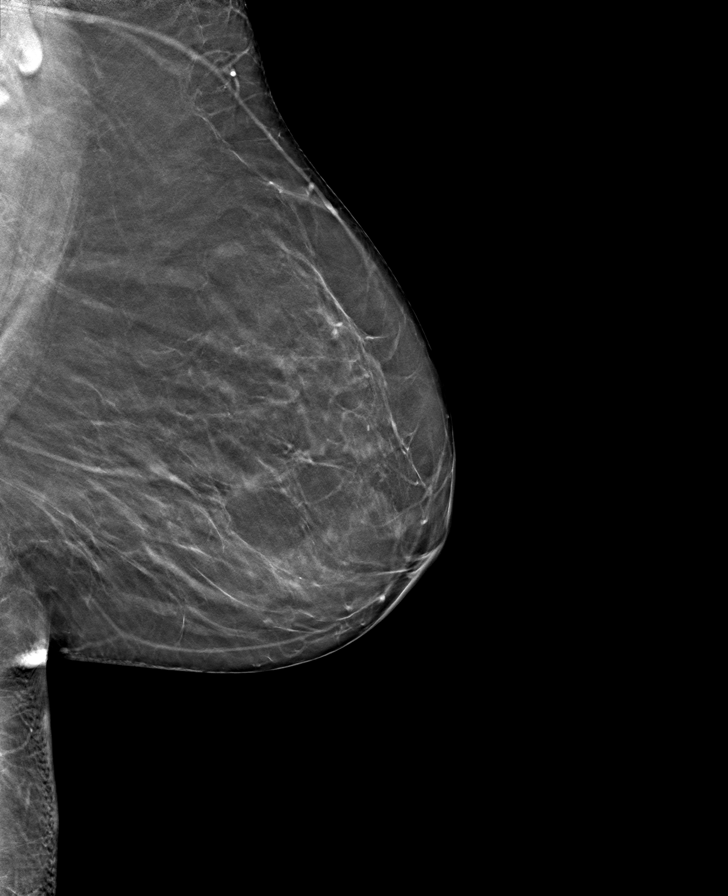

[R CC tomo · tomo slice 35/68.0]
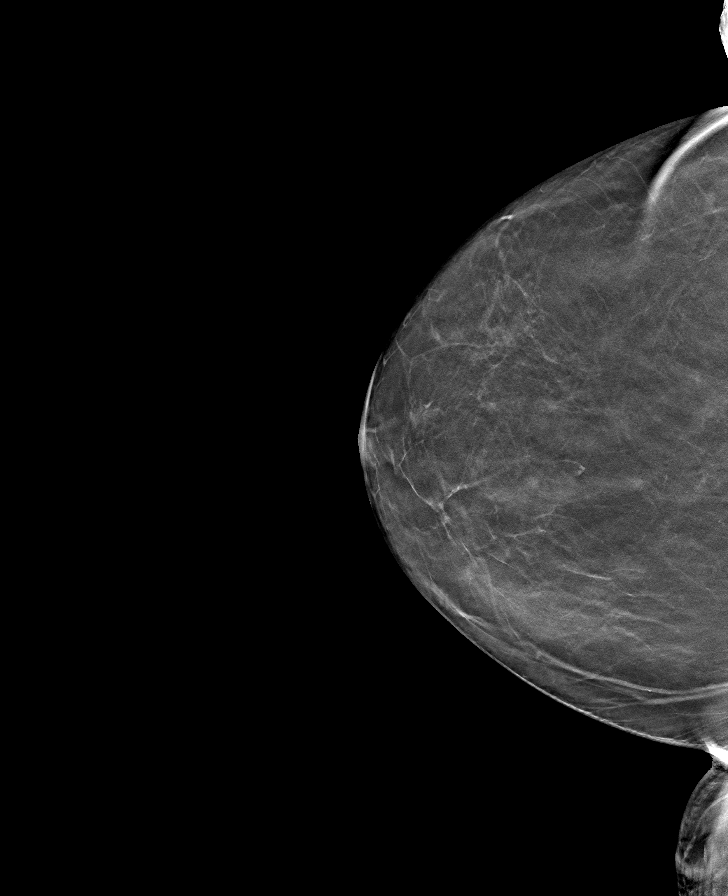

[L CC tomo · tomo slice 33/65.0]
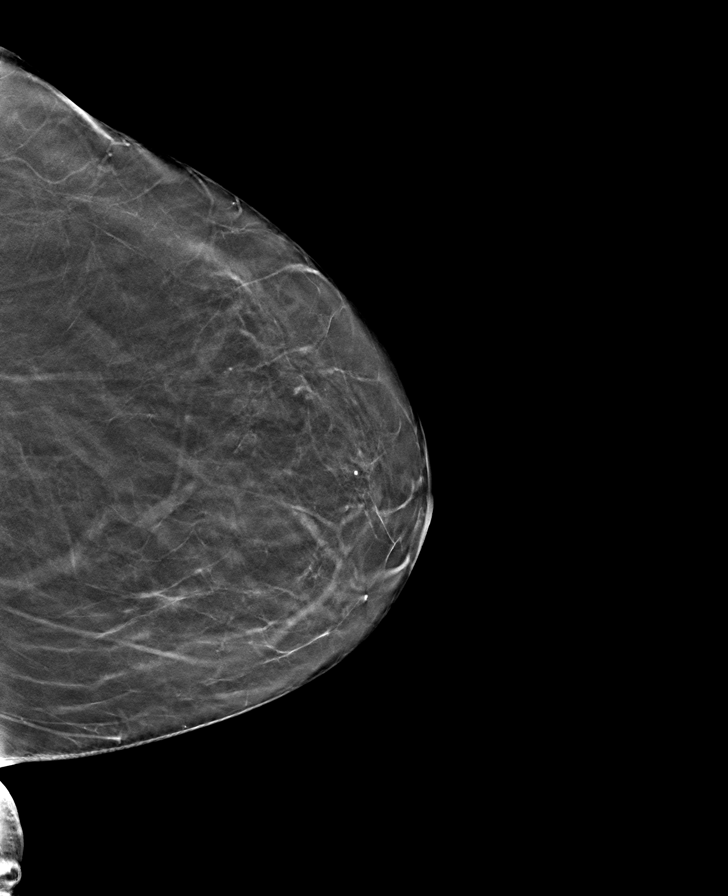

[R MLO tomo · tomo slice 35/68.0]
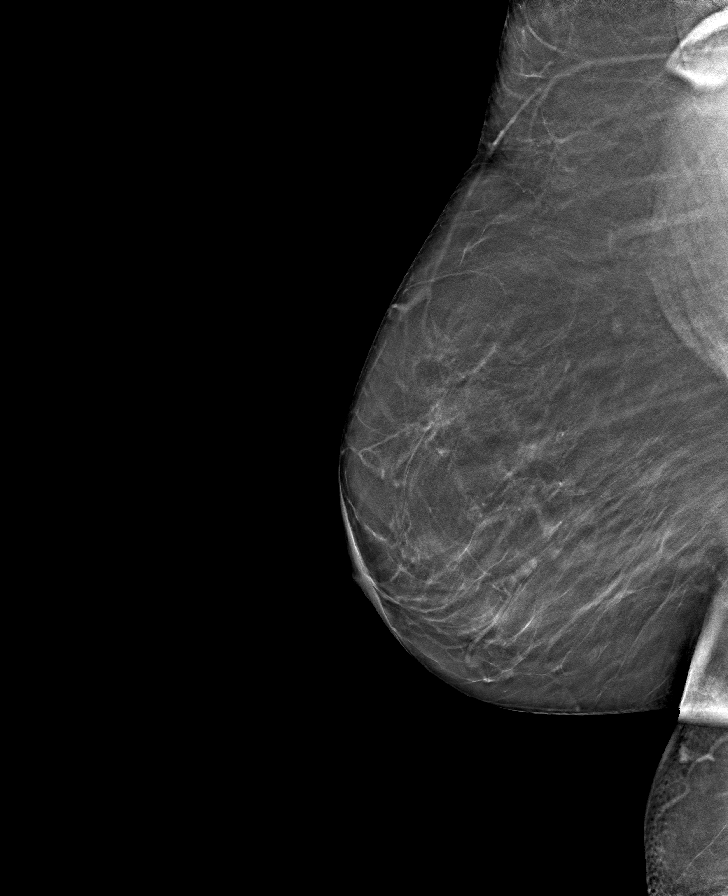

[8 of 24 positions shown; findings below may reference images not displayed]

Please note 
comparison with prior examinations increases the sensitivity for the detection 
of malignancy and decreases the false positive recall rate. If prior 
examinations are made available an addendum to this report will be generated.
FINDINGS: Well-circumscribed 3 mm nodular density in the upper-outer quadrant of 
the right breast felt to be benign. No suspicious mass, calcifications, or area 
of architectural distortion in either breast.
IMPRESSION: No mammographic evidence of malignancy in either breast. 
( BI-RADS 2) Benign findings. Routine mammographic follow-up is recommended.

## 2021-03-28 IMAGING — DX CHEST PA AND LATERAL
2 series · 2 of 2 positions shown · non-contrast
Comparison: None

FINAL Diagnostic Imaging Report 
________________________________________________________________________________________________ 
CLINICAL INDICATION: History of pneumonia 3 weeks ago. Patient feels better. 
History of surgery 5 years ago for left lower lobe removal, benign. Smoking 
cessation 0000. Bullae of the lung. Moderate persistent asthma.

[lateral]
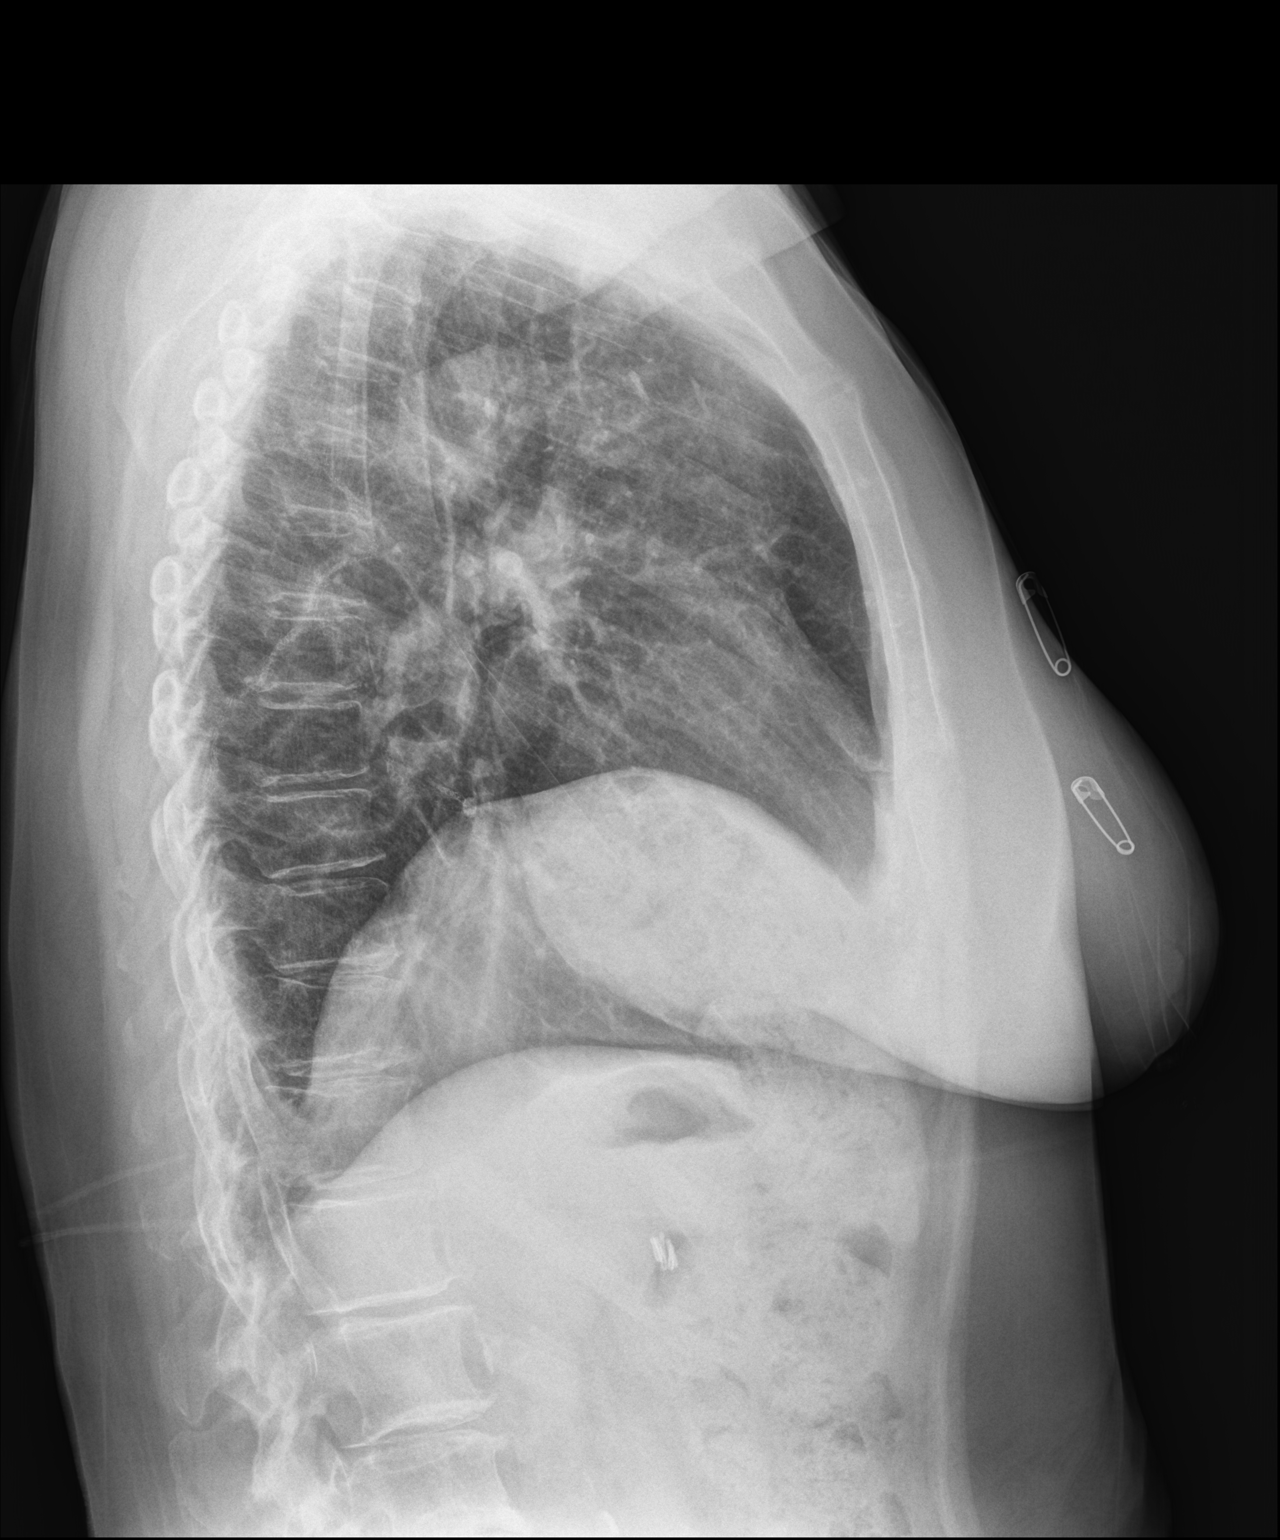

[PA]
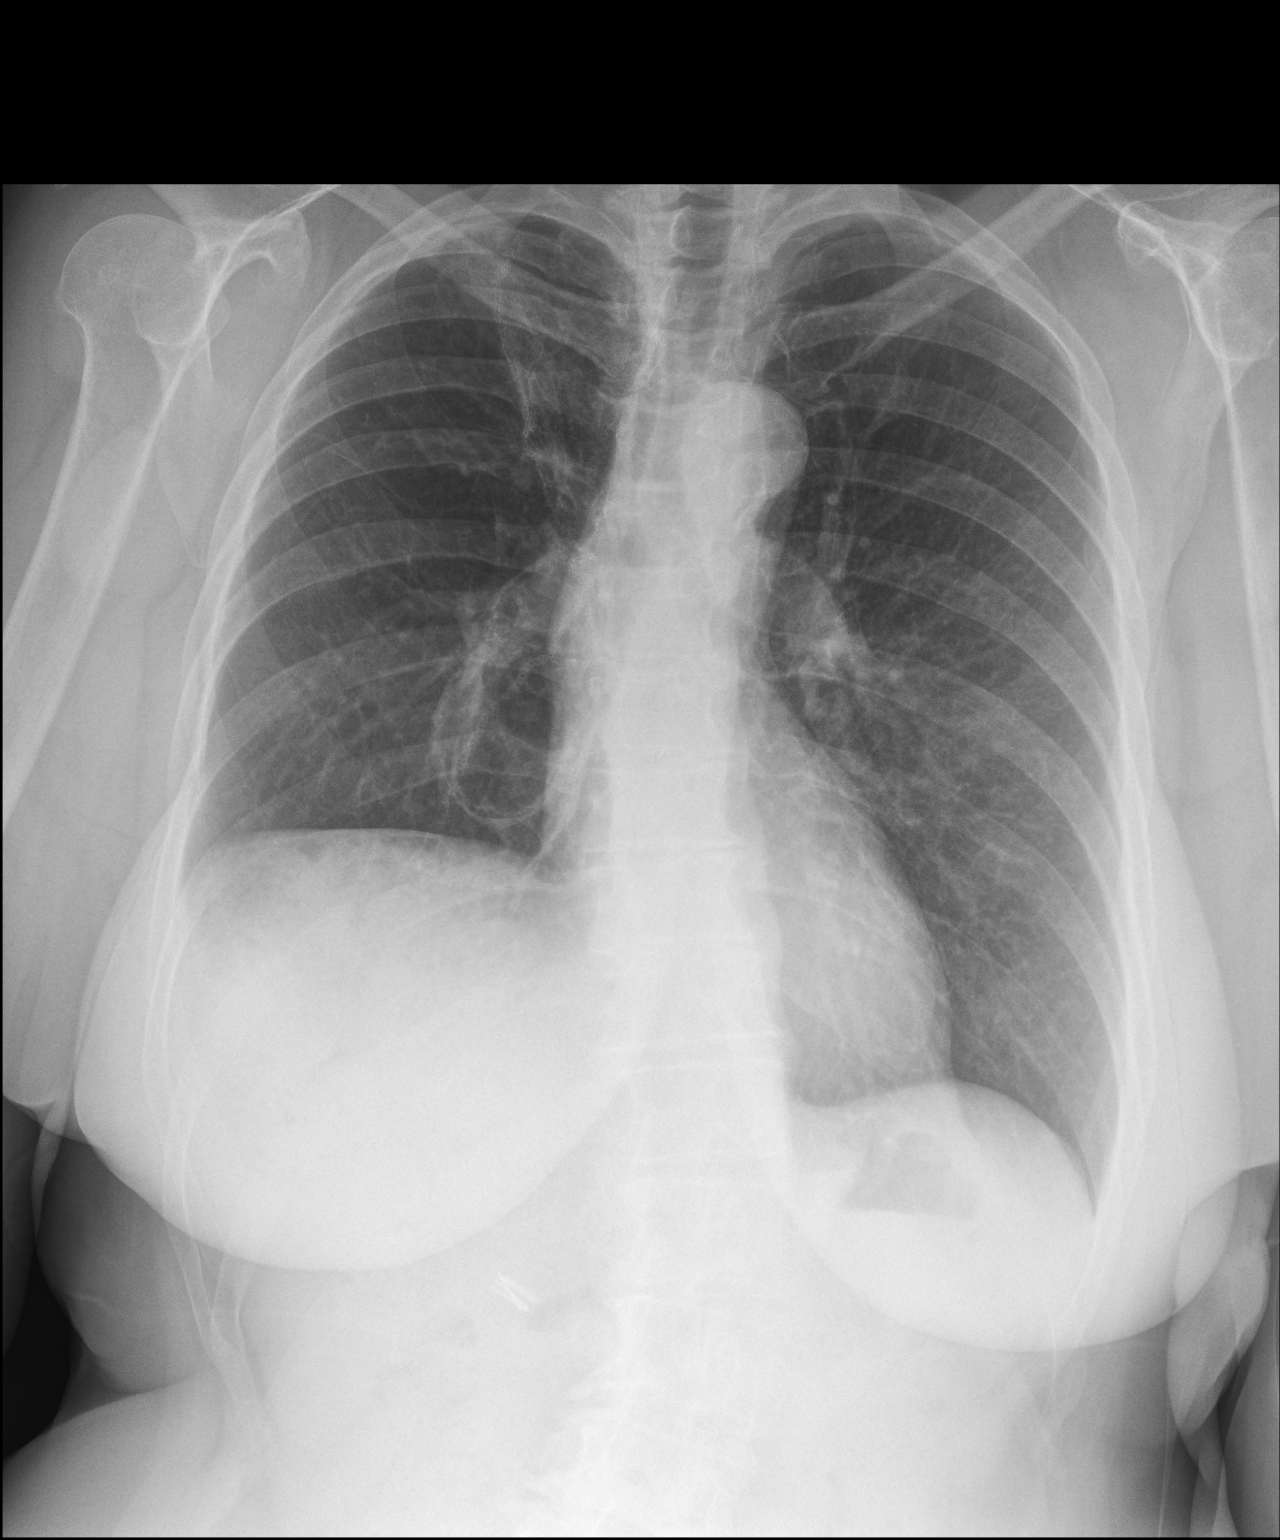

[2 of 2 positions shown; findings below may reference images not displayed]

FINDINGS: Postsurgical changes right lung with volume loss and suture lines. No 
consolidation. No effusion. No pneumothorax. Normal cardiac size and pulmonary 
vascularity. Surgical clips right upper quadrant. No acute osseous abnormality.
IMPRESSION: Postsurgical changes. 
No discrete acute cardiopulmonary findings.

## 2021-06-14 ENCOUNTER — Telehealth: Payer: Self-pay | Admitting: Primary Care

## 2021-06-14 NOTE — Telephone Encounter (Signed)
LMTCO to schedule her next Subsequent Annual Medicare Wellness Visit

## 2021-08-09 IMAGING — CT CT ABDOMEN AND PELVIS WITH CONTRAST
2 of 3 series · 15 of 46 positions shown, 17 images · IV contrast (ISOVUE 300)
Comparison: None

________________________________________________________________________________________________ 
CT ABDOMEN AND PELVIS WITH CONTRAST, 08/09/2021 [DATE]: 
A search for DICOM formatted images was conducted for prior CT imaging studies 
completed at a non-affiliated media free facility.   
CLINICAL INDICATION: Epigastric pain.
TECHNIQUE: The abdomen and pelvis was scanned from lung bases through the pubic 
rami with 100 mL of Isovue 300 on a high-resolution CT scanner using dose 
reduction techniques.  Routine MPR reconstructions were performed. The patients 
eGFR was calculated to be 82.5 mL/min/1.73 m2 using the i-STAT device.

[Series 10: abd/pel ax w · axial · 0.90mm/px · z∈[-597,-147]mm · 12 of 174 slices shown, 14 images]
[im 12/174  soft-tissue]
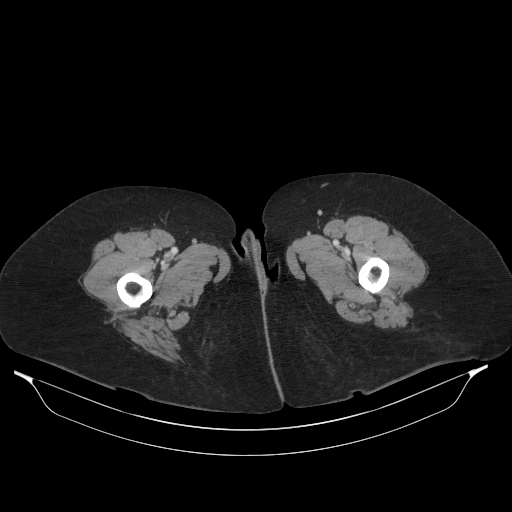
[im 12/174  bone]
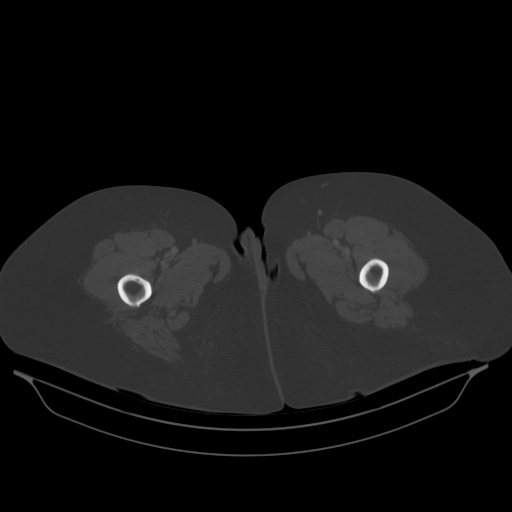
[im 23/174  soft-tissue]
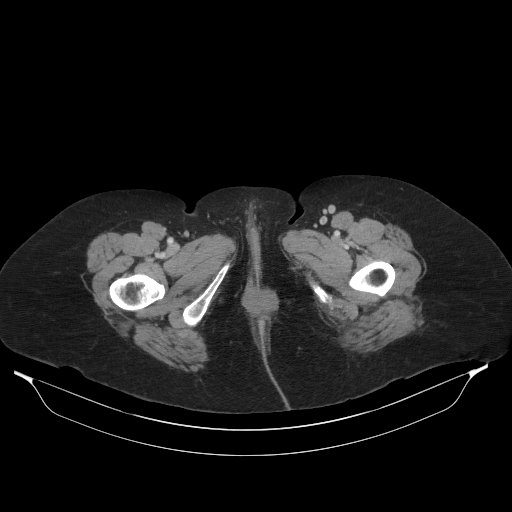
[im 40/174  soft-tissue]
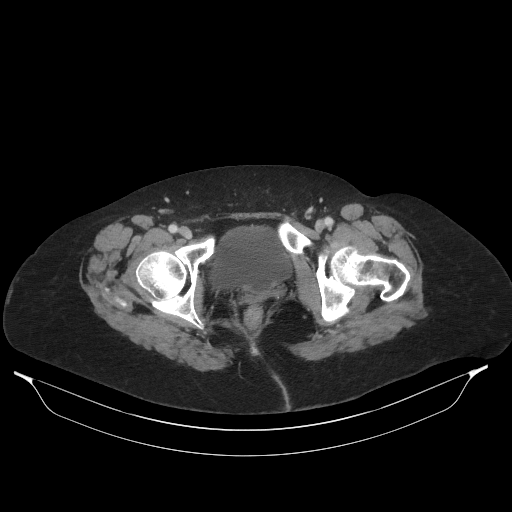
[im 51/174  soft-tissue]
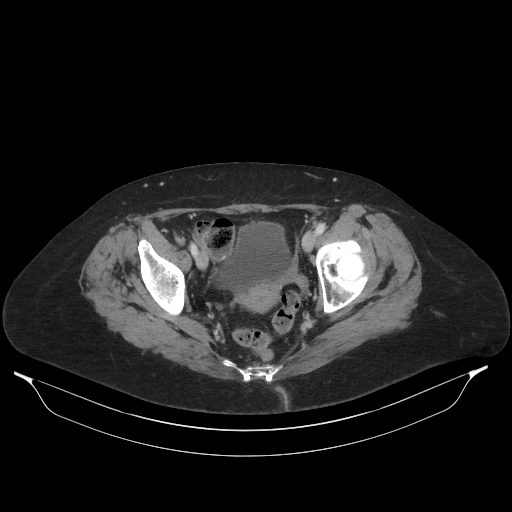
[im 67/174  soft-tissue]
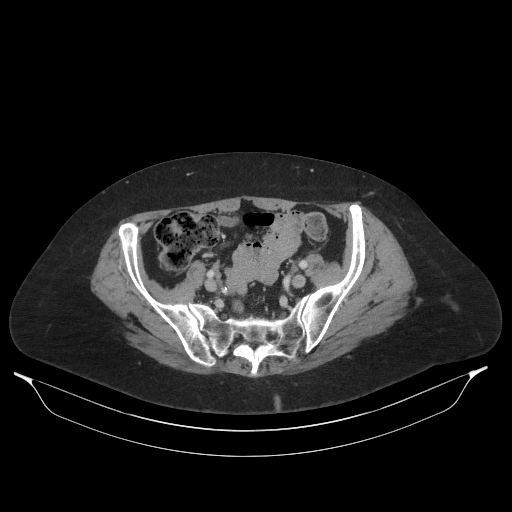
[im 79/174  soft-tissue]
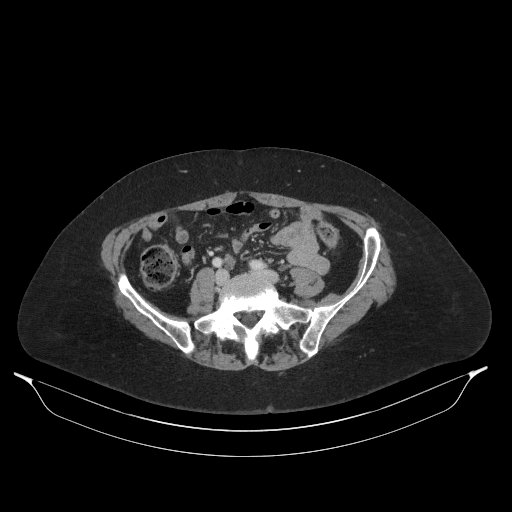
[im 95/174  soft-tissue]
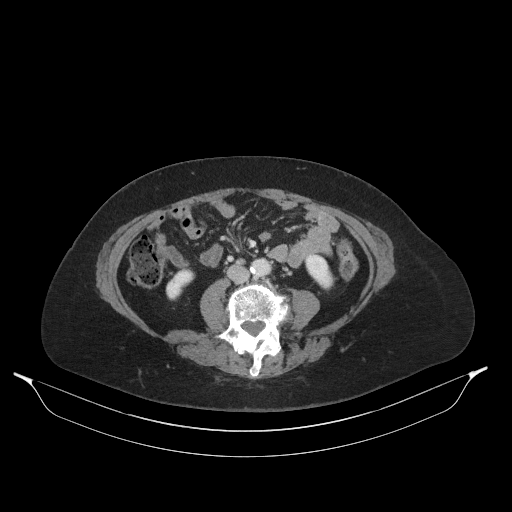
[im 107/174  soft-tissue]
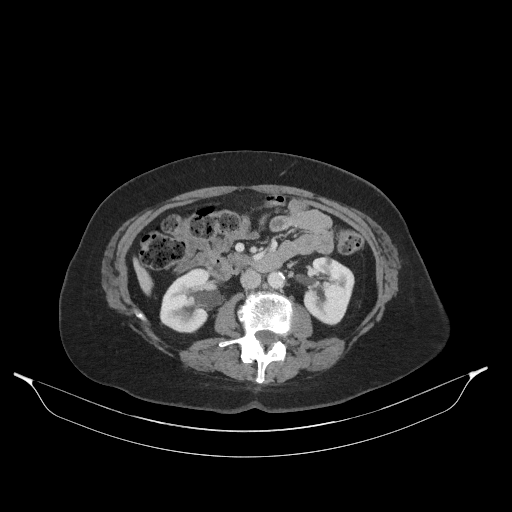
[im 123/174  soft-tissue]
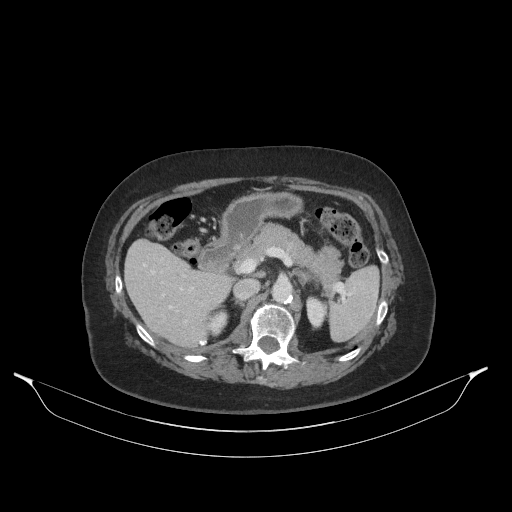
[im 123/174  bone]
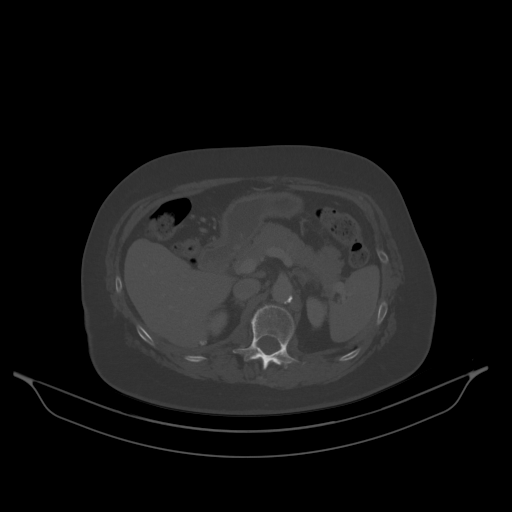
[im 134/174  soft-tissue]
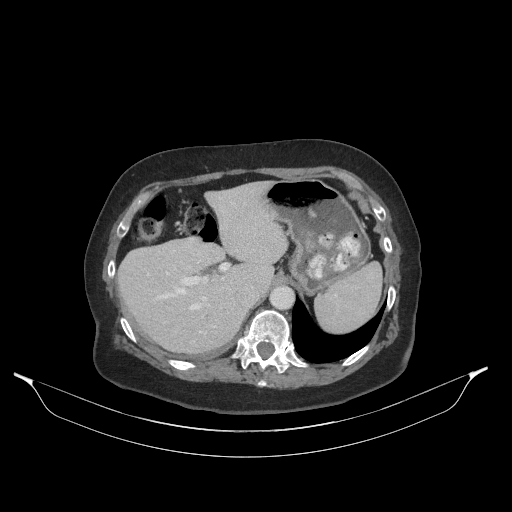
[im 151/174  soft-tissue]
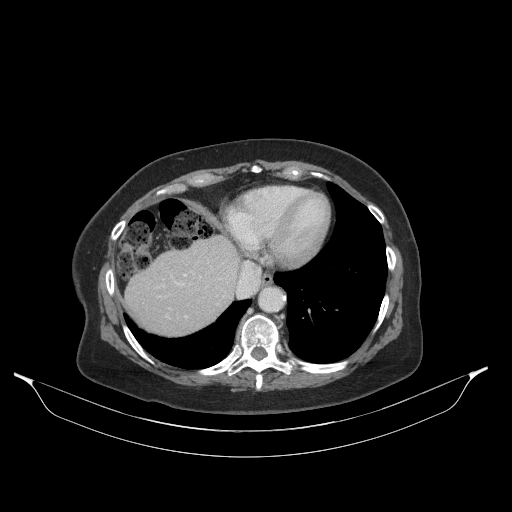
[im 162/174  soft-tissue]
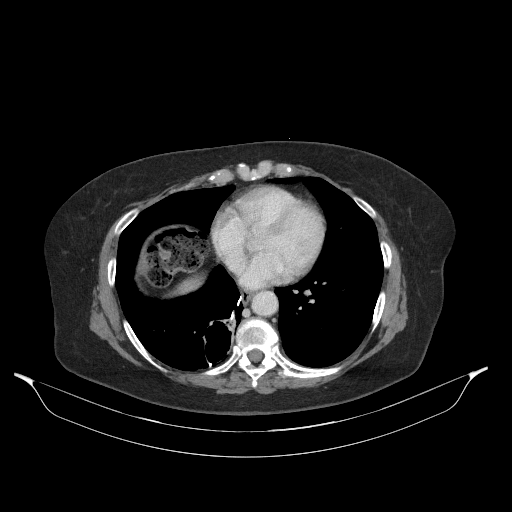

[Series 11: abd/pel cor w · coronal · 0.72mm/px · 3 of 109 slices shown]
[im 37/109  soft-tissue]
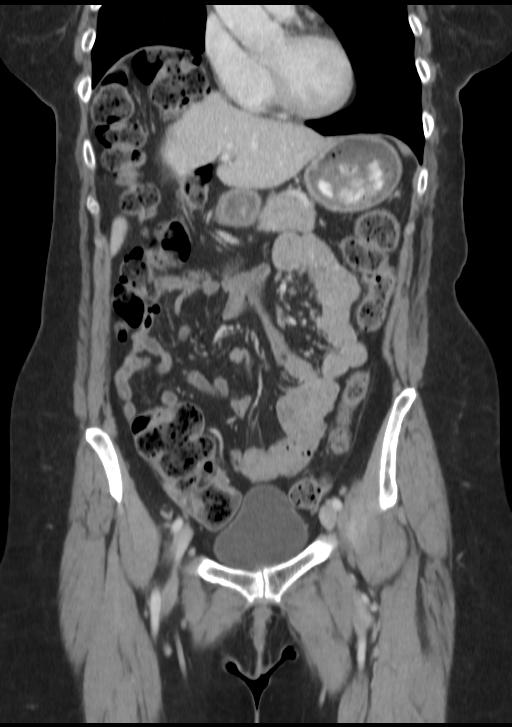
[im 49/109  soft-tissue]
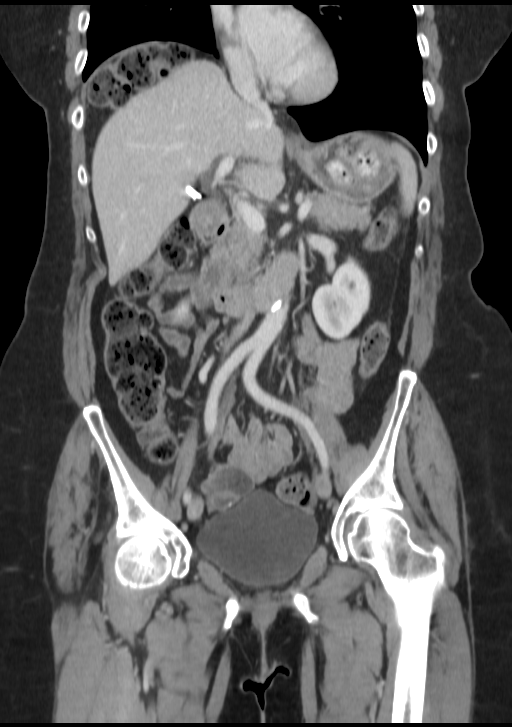
[im 61/109  soft-tissue]
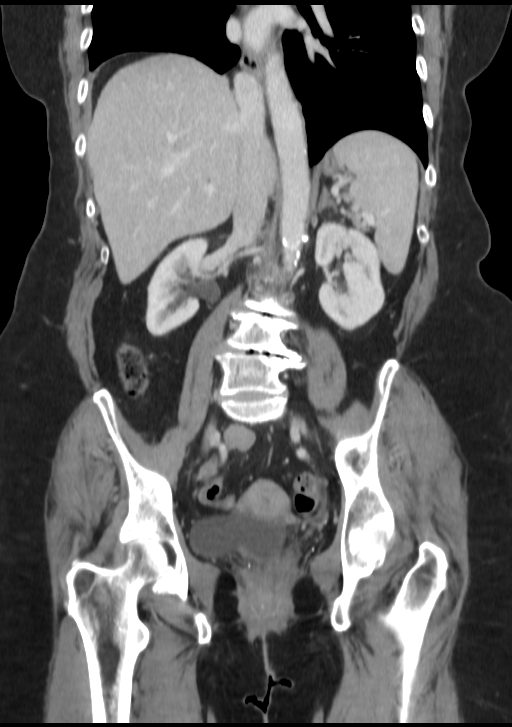

[15 of 46 positions shown; findings below may reference images not displayed]

FINDINGS: LUNG BASES: Mild/moderate bullous disease. The largest bulla is within the right 
lower lobe (measuring 6 x 3.5 cm) and demonstrates some wall thickening but no 
air-fluid level or fungus ball. Postsurgical change right upper lobe and right 
lower lobe. Mild peribronchial thickening and scattered areas of atelectasis. No 
consolidation or effusion. No mediastinal or hilar adenopathy. Elevation right 
hemidiaphragm. 
HEPATOBILIARY: No mass or biliary dilatation. Status post cholecystectomy. 
Question gallstone posterior to the liver inferiorly.. 
SPLEEN: Normal in size. 
PANCREAS: No ductal dilatation or mass.   
ADRENALS: Mild thickening the lens. No mass. 
GENITOURINARY: Cysts No enhancing mass or hydronephrosis.  Bladder is 
unremarkable. 
LYMPH NODES: No adenopathy. 
STOMACH, SMALL BOWEL AND COLON: No bowel wall thickening or obstruction. Large 
amount of stool within the colon. No pericolonic inflammatory process or 
collection. 
VASCULAR STRUCTURES: No aneurysm.  
MUSCULOSKELETAL: No acute osseous abnormality. Scattered degenerative changes.  
ADDITIONAL FINDINGS: None.
IMPRESSION: Mild/moderate bullous disease with a large bulla within the right lower lobe the 
liver appears to be postsurgical change. There is mild wall thickening of this 
bulla but no evidence of air-fluid level or fungal ball. 
Peribronchial thickening with gadolinium areas of atelectasis but no acute 
consolidation. 
No thoracic, abdominal or pelvic adenopathy. 
Large amount of stool within the colon. Recommend clinical correlation for 
impaction. No pericolonic inflammatory process or collection.. 
RADIATION DOSE REDUCTION: All CT scans are performed using radiation dose 
reduction techniques, when applicable.  Technical factors are evaluated and 
adjusted to ensure appropriate moderation of exposure.  Automated dose 
management technology is applied to adjust the radiation doses to minimize 
exposure while achieving diagnostic quality images.

## 2021-08-09 IMAGING — CT CT CHEST WITHOUT CONTRAST
2 of 3 series · 15 of 36 positions shown, 18 images · IV contrast (isovue)
Comparison: None

________________________________________________________________________________________________ 
CT CHEST WITHOUT CONTRAST, 08/09/2021 [DATE]: 
CT ABDOMEN AND PELVIS WITH CONTRAST, 08/09/2021 [DATE]: 
A search for DICOM formatted images was conducted for prior CT imaging studies 
completed at a non-affiliated media free facility.   
CLINICAL INDICATION: Epigastric pain. Chronic cough and asthma.
TECHNIQUE: The abdomen and pelvis was scanned from lung bases through the pubic 
rami with 100 mL of Isovue 300 on a high-resolution CT scanner using dose 
reduction techniques.  Routine MPR reconstructions were performed. The patients 
eGFR was calculated to be 82.5 mL/min/1.73 m2 using the i-STAT device.

[Series 4: chest 2.0 i31s 3 · axial · 0.83mm/px · z∈[-265,+1]mm · 12 of 157 slices shown, 15 images]
[im 12/157  mediastinal]
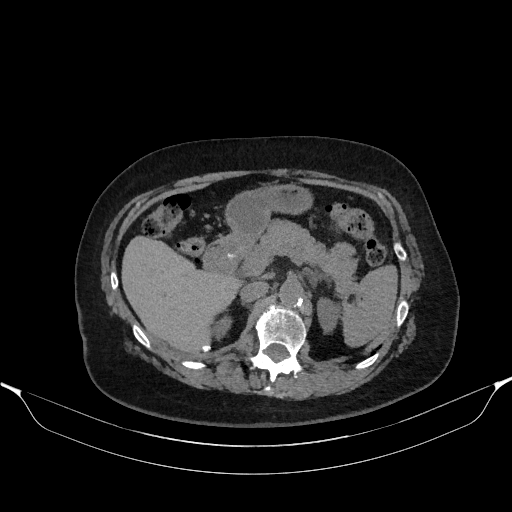
[im 12/157  lung]
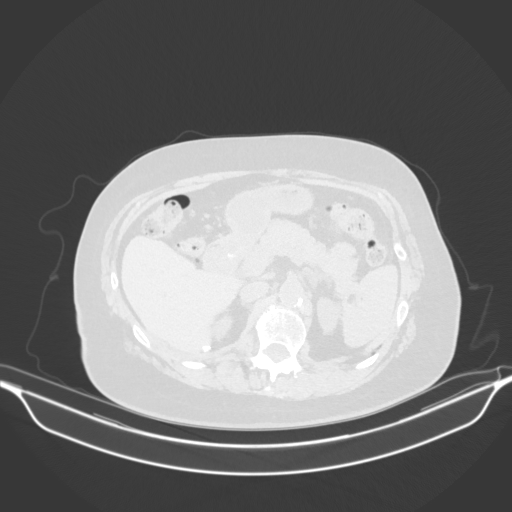
[im 24/157  lung]
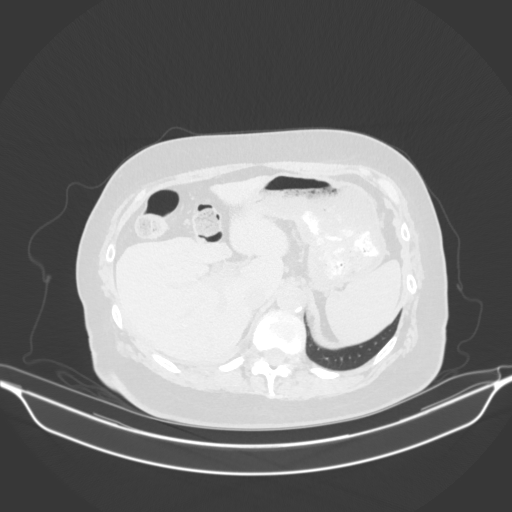
[im 35/157  lung]
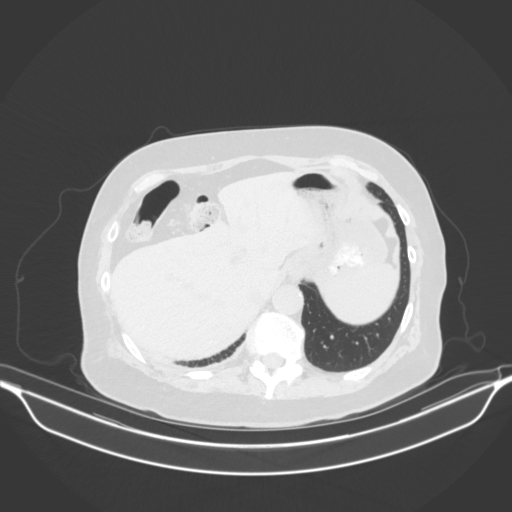
[im 47/157  lung]
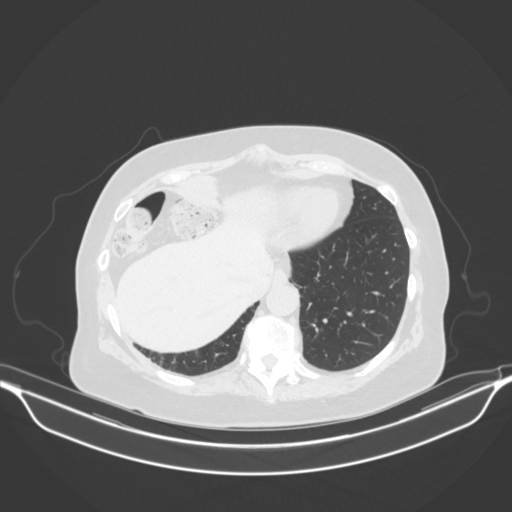
[im 58/157  mediastinal]
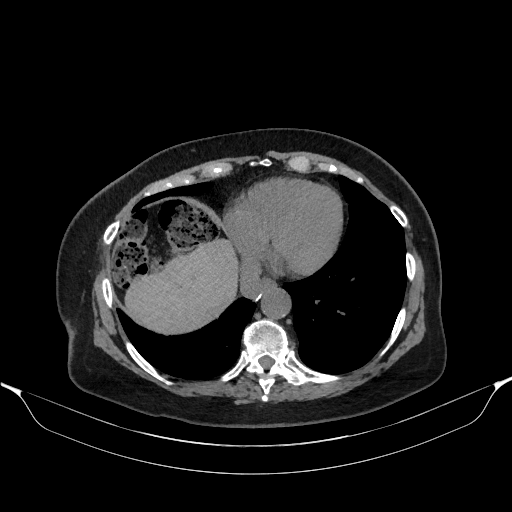
[im 58/157  lung]
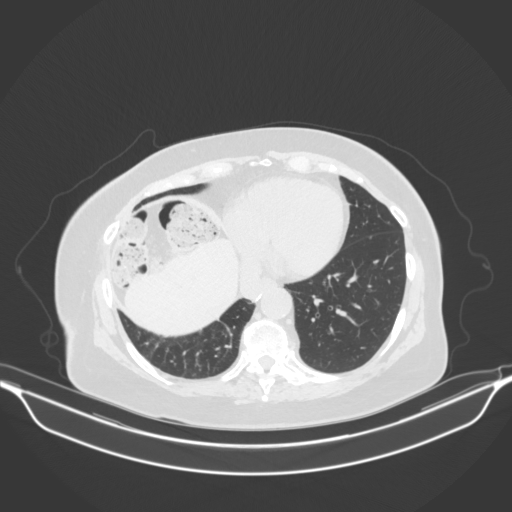
[im 70/157  lung]
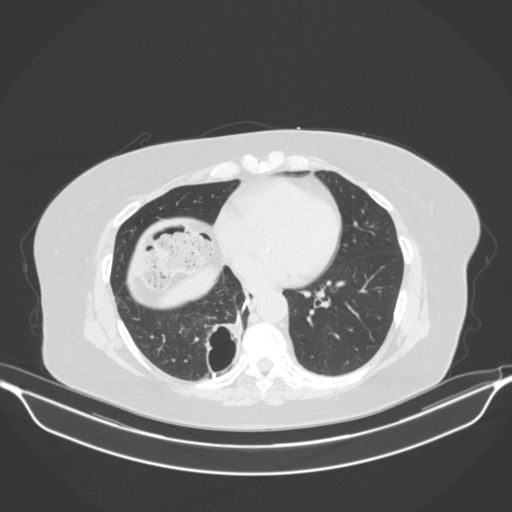
[im 87/157  lung]
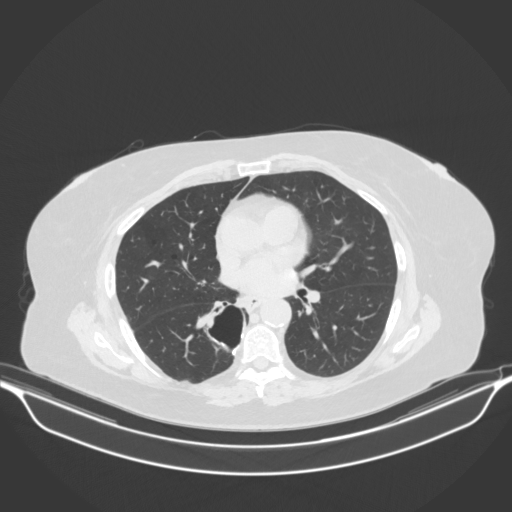
[im 99/157  lung]
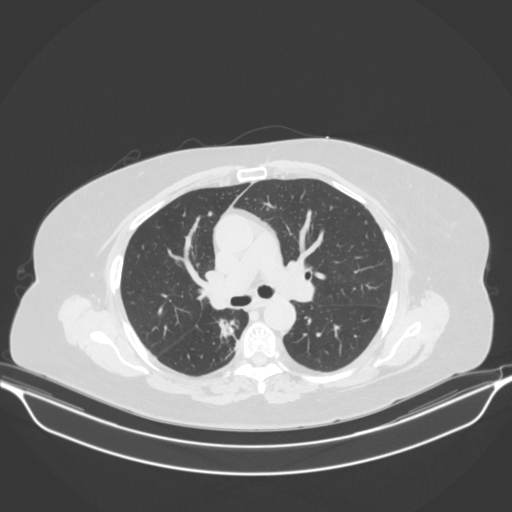
[im 110/157  mediastinal]
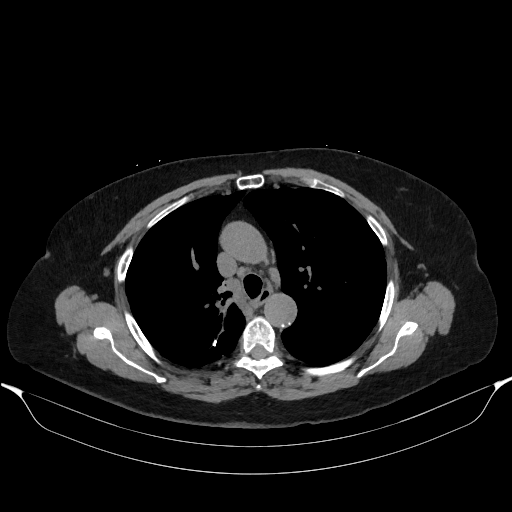
[im 110/157  lung]
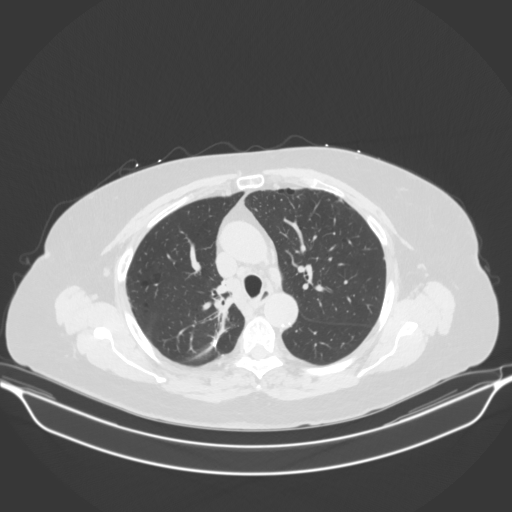
[im 122/157  lung]
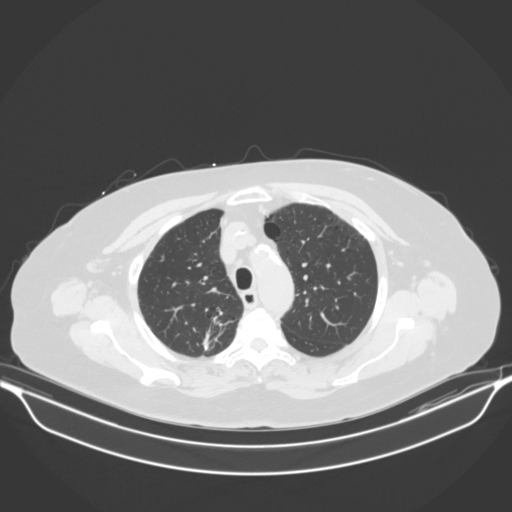
[im 133/157  lung]
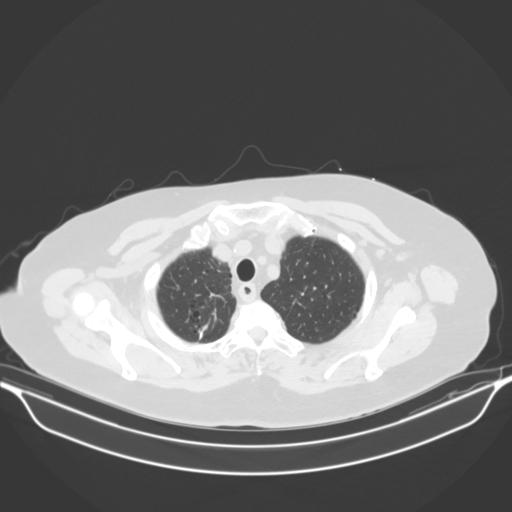
[im 145/157  lung]
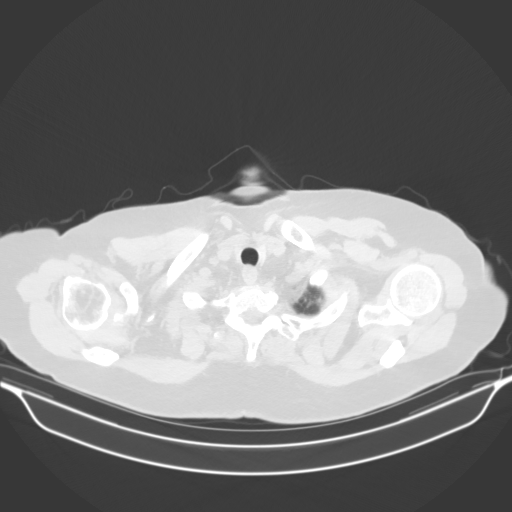

[Series 8: coronal · coronal · 0.61mm/px · 3 of 118 slices shown]
[im 24/118  lung]
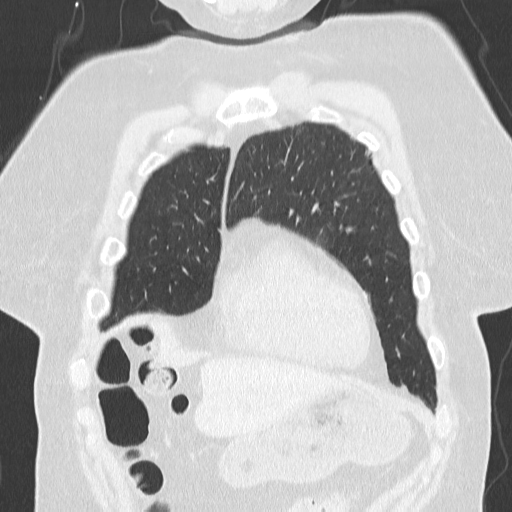
[im 47/118  lung]
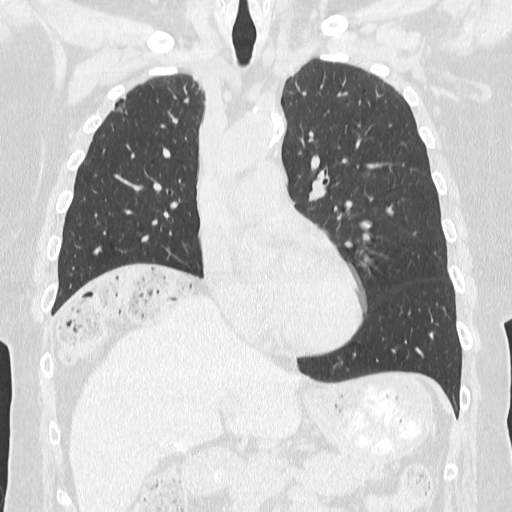
[im 71/118  lung]
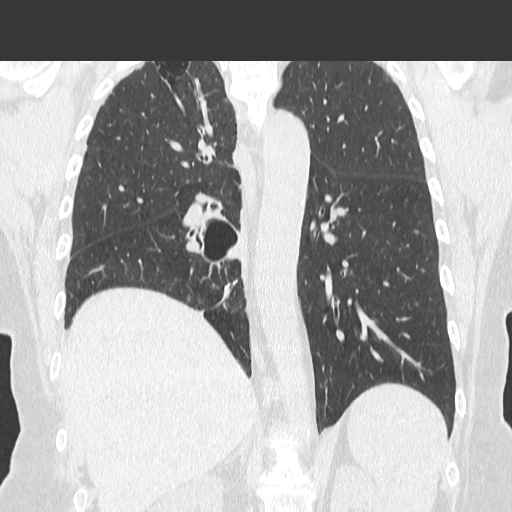

[15 of 36 positions shown; findings below may reference images not displayed]

FINDINGS: LUNG BASES: Mild/moderate bullous disease. The largest bulla is within the right 
lower lobe (measuring 6 x 3.5 cm) and demonstrates some wall thickening but no 
air-fluid level or fungus ball. Postsurgical change right upper lobe and right 
lower lobe. Mild peribronchial thickening and scattered areas of atelectasis. No 
consolidation or effusion. No mediastinal or hilar adenopathy. Elevation right 
hemidiaphragm. 
MEDIASTINUM: No mass or adenopathy. 
HEART: Normal configuration. No pericardial effusion. 
HEPATOBILIARY: No mass or biliary dilatation. Status post cholecystectomy. 
Question gallstone posterior to the liver inferiorly.. 
SPLEEN: Normal in size. 
PANCREAS: No ductal dilatation or mass.   
ADRENALS: Mild thickening the lens. No mass. 
GENITOURINARY: Cysts No enhancing mass or hydronephrosis.  Bladder is 
unremarkable. 
LYMPH NODES: No adenopathy. 
STOMACH, SMALL BOWEL AND COLON: No bowel wall thickening or obstruction. Large 
amount of stool within the colon. No pericolonic inflammatory process or 
collection. 
VASCULAR STRUCTURES: No aneurysm.  
MUSCULOSKELETAL: No acute osseous abnormality. Scattered degenerative changes.  
ADDITIONAL FINDINGS: None.
IMPRESSION: Mild/moderate bullous disease with a large bulla within the right lower lobe the 
liver appears to be postsurgical change. There is mild wall thickening of this 
bulla but no evidence of air-fluid level or fungal ball. 
Peribronchial thickening with gadolinium areas of atelectasis but no acute 
consolidation. 
No thoracic, abdominal or pelvic adenopathy. 
Large amount of stool within the colon. Recommend clinical correlation for 
impaction. No pericolonic inflammatory process or collection.. 
RADIATION DOSE REDUCTION: All CT scans are performed using radiation dose 
reduction techniques, when applicable.  Technical factors are evaluated and 
adjusted to ensure appropriate moderation of exposure.  Automated dose 
management technology is applied to adjust the radiation doses to minimize 
exposure while achieving diagnostic quality images.

## 2021-09-16 IMAGING — MR MRI ABDOMEN W/WO CONTRAST WITH MRCP
20 of 25 series · 34 of 48 positions shown · IV contrast (gadolinium)
Comparison: CT abdomen and pelvis August 09, 2021.

________________________________________________________________________________________________ 
MRI ABDOMEN W/WO CONTRAST WITH MRCP, 09/16/2021 [DATE]: 
CLINICAL INDICATION: Other specified diseases of liver. Patient complains of mid 
abdominal pain.
TECHNIQUE: Multiplanar, multi acquisition MR images of the abdomen were 
performed without and with intravenous gadolinium enhancement including dynamic 
imaging.  MRCP sequences were performed with post processing.  8 mL of Gadavist 
were injected intravenously by hand. Patient was scanned on a 1.5T magnet. The 
patients eGFR was calculated to be 82.5 mL/min/1.73 m2 using the i-STAT device.

[Series 301: survey bh navi · axial · 15.0mm · 1.76mm/px · 1 of 15 slices shown]
[im 1/15]
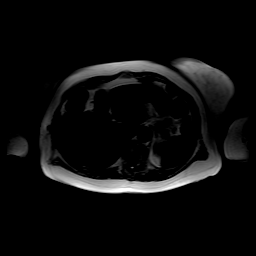

[Series 401: T2 · coronal · 5.0mm · 0.81mm/px · 1 of 32 slices shown]
[im 1/32]
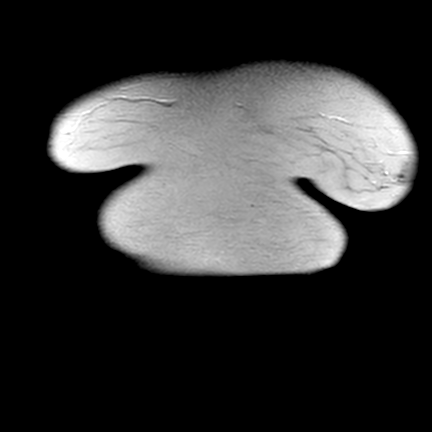

[Series 502: sout of phase · axial · 6.0mm · 1.09mm/px · 1 of 36 slices shown]
[im 1/36]
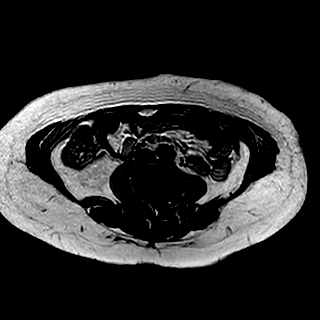

[Series 503: sin phase · axial · 6.0mm · 1.09mm/px · 1 of 36 slices shown]
[im 1/36]
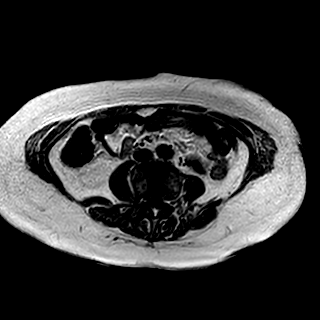

[Series 601: t2_ax_mvxd_hr_rt · axial · 5.0mm · 0.88mm/px · 1 of 42 slices shown]
[im 1/42]
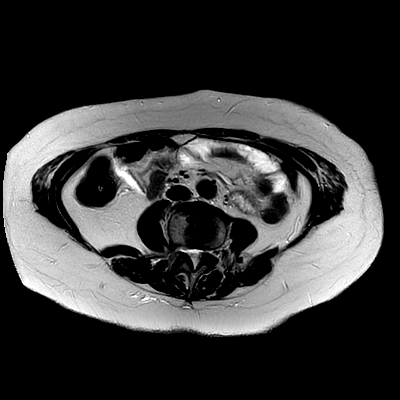

[Series 701: t2_spair_ax_mvxd_hr (panc)_rt · axial · 3.0mm · 0.88mm/px · 1 of 40 slices shown]
[im 1/40]
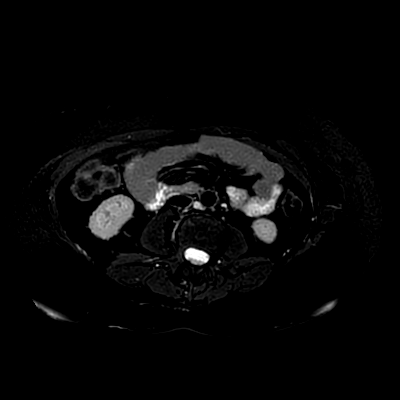

[Series 801: 3d_grase_bh · coronal · 2.4mm · 0.73mm/px · 1 of 64 slices shown]
[im 1/64]
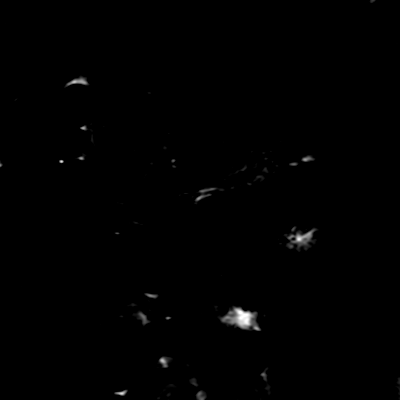

[Series 901: ssh_mrcp rad · coronal · 40.0mm · 0.59mm/px · 1 of 6 slices shown]
[im 1/6]
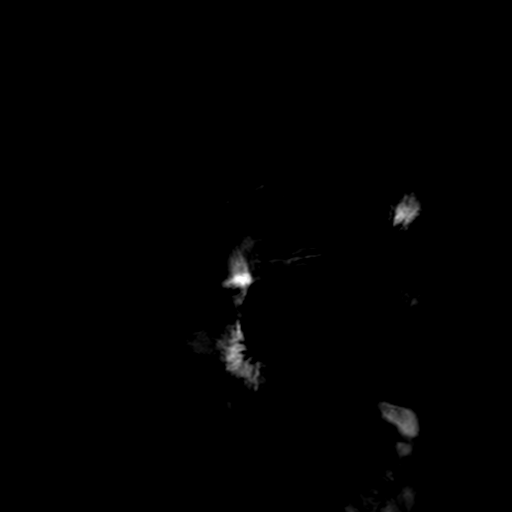

[Series 1001: dwi_3b_rt · axial · 5.0mm · 1.58mm/px · 1 of 92 slices shown]
[im 1/92]
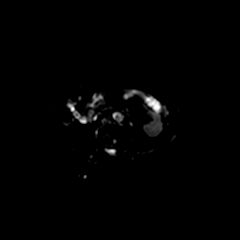

[Series 1002: sbo · axial · 5.0mm · 1.58mm/px · 1 of 46 slices shown]
[im 1/46]
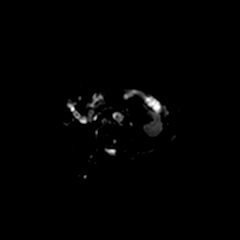

[Series 1003: (id) · axial · 5.0mm · 1.58mm/px · 1 of 46 slices shown]
[im 1/46]
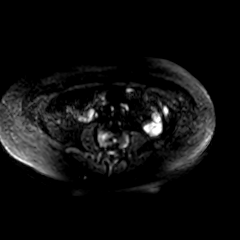

[Series 1004: dadc 600 · axial · 5.0mm · 1.58mm/px · 1 of 46 slices shown]
[im 1/46]
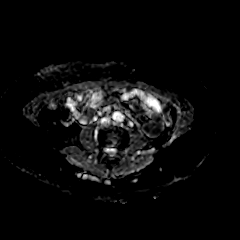

[Series 1101: mdixon_dyn_(person_name)_(person_name) · axial · 4.0mm · 0.91mm/px · z∈[+112,+370]mm · 8 of 650 slices shown]
[im 1/650]
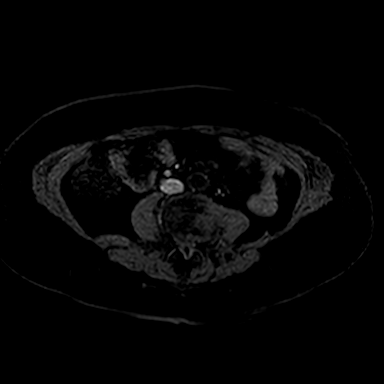
[im 119/650]
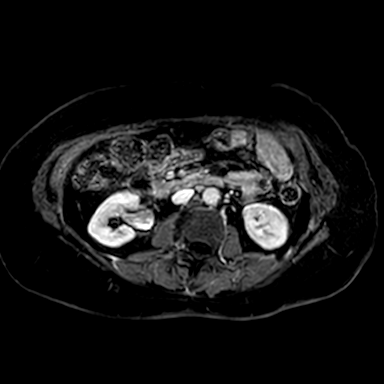
[im 178/650]
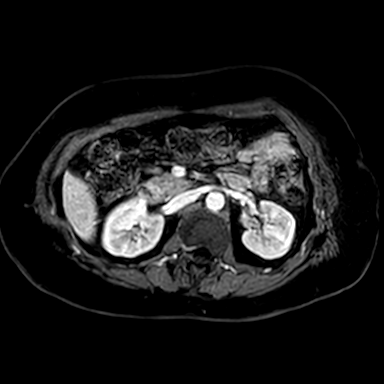
[im 296/650]
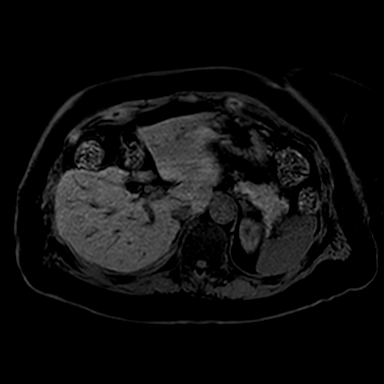
[im 355/650]
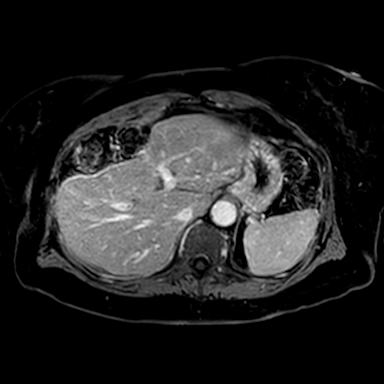
[im 473/650]
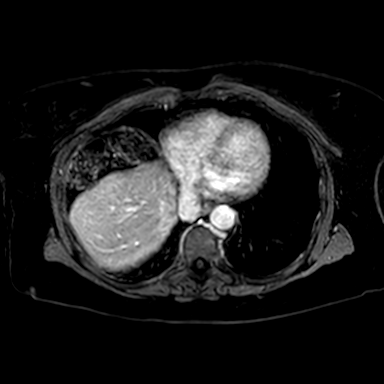
[im 532/650]
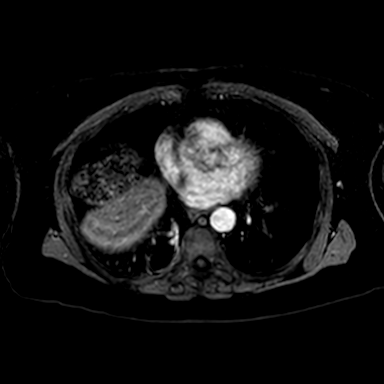
[im 650/650]
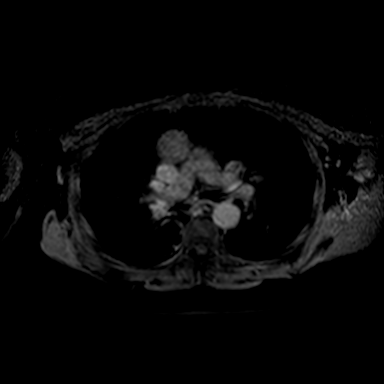

[Series 1102: DIXON · axial · 4.0mm · 0.91mm/px · z∈[+112,+370]mm · 2 of 130 slices shown (1 of 5)]
[im 1/130]
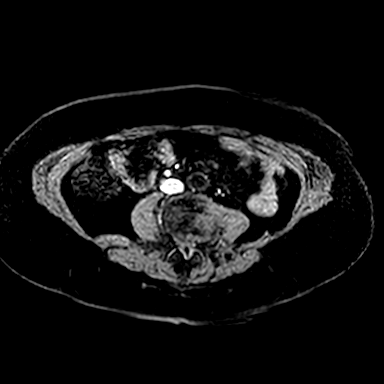
[im 130/130]
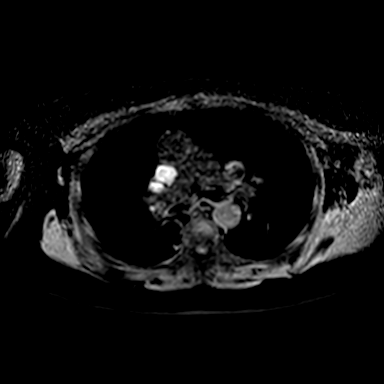

[Series 1103: DIXON · axial · 4.0mm · 0.91mm/px · z∈[+112,+370]mm · 2 of 130 slices shown (2 of 5)]
[im 1/130]
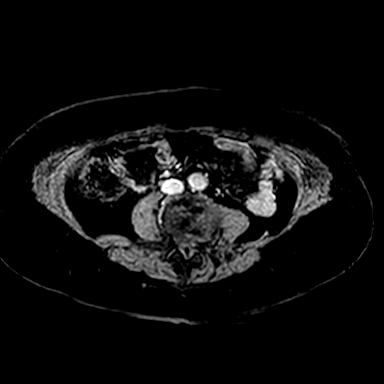
[im 130/130]
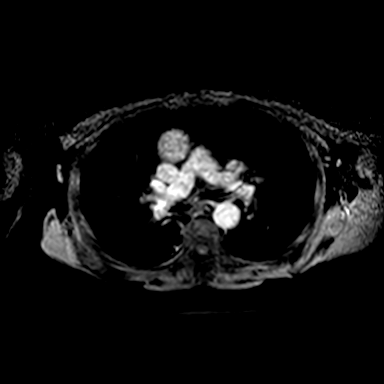

[Series 1104: DIXON · axial · 4.0mm · 0.91mm/px · z∈[+112,+370]mm · 2 of 130 slices shown (3 of 5)]
[im 1/130]
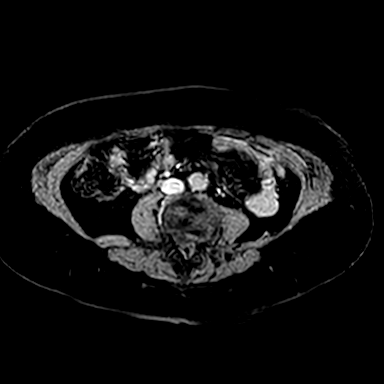
[im 130/130]
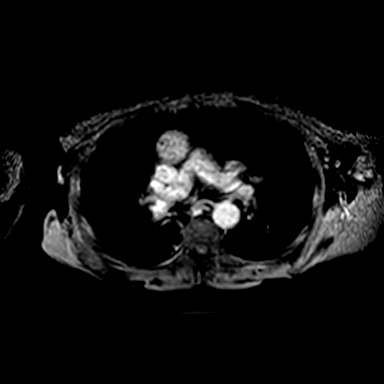

[Series 1105: DIXON · axial · 4.0mm · 0.91mm/px · z∈[+112,+370]mm · 2 of 130 slices shown (4 of 5)]
[im 1/130]
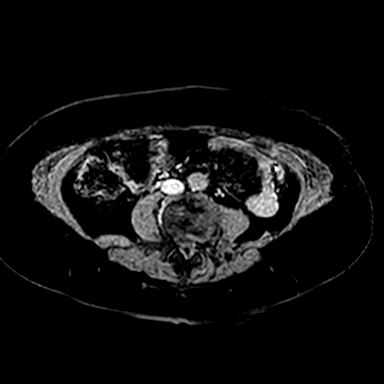
[im 130/130]
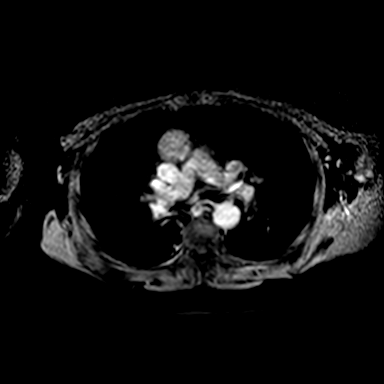

[Series 1106: DIXON · axial · 4.0mm · 0.91mm/px · z∈[+112,+370]mm · 2 of 130 slices shown (5 of 5)]
[im 1/130]
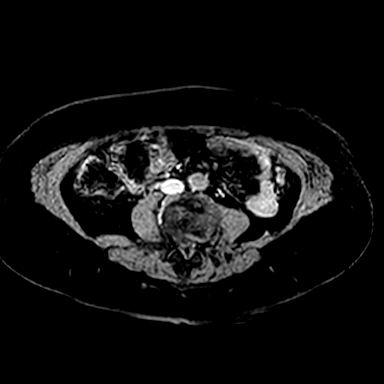
[im 130/130]
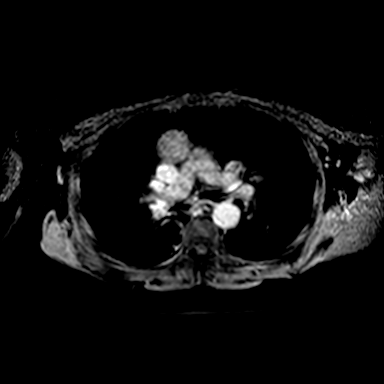

[Series 1107: DIXON post-contrast · axial · 4.0mm · 0.91mm/px · z∈[+112,+370]mm · 2 of 130 slices shown (1 of 2)]
[im 1/130]
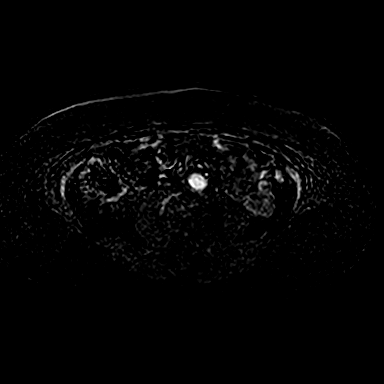
[im 130/130]
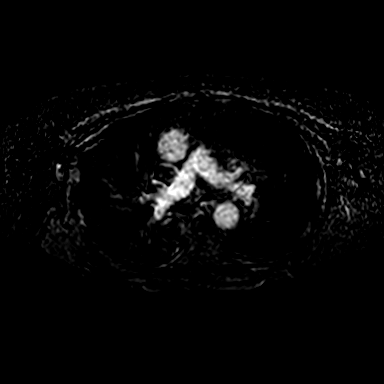

[Series 1108: DIXON post-contrast · axial · 4.0mm · 0.91mm/px · z∈[+112,+370]mm · 2 of 130 slices shown (2 of 2)]
[im 1/130]
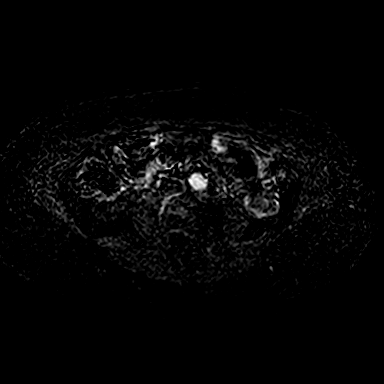
[im 130/130]
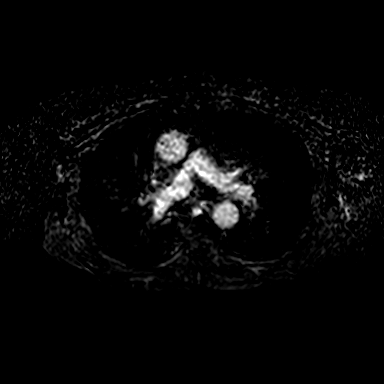

[34 of 48 positions shown; findings below may reference images not displayed]

FINDINGS: The liver appears normal in overall size and shape. No focal hepatic 
abnormalities are found. There is no evidence for hepatic iron overload or 
steatosis. The gallbladder is absent. The caliber of the biliary tract is 
normal. No intraductal filling defects are found in the biliary tract. The 
pancreatic parenchyma and duct appear normal. No evidence for pancreas divisum. 
The hepatic and portal veins are patent. The spleen is normal. The adrenal 
glands are normal. The kidneys are within normal limits except for a 
subcentimeter cyst in the anterior cortex of LEFT kidney. There is a 
thick-walled cavity in the medial posterior base of the RIGHT lower lobe 
unchanged from the CT performed August 09, 2021. There are extensive 
degenerative changes in the lumbar spine with rotoscoliotic curvature. There is 
relative elevation of the RIGHT hemidiaphragm. Incidentally imaged portions of 
the GI tract are unremarkable. Abdominal aorta is normal in caliber. No ascites 
is found in the abdomen. There are no enlarged abdominal lymph nodes.
IMPRESSION: 1. The liver, biliary tract, pancreas, and pancreatic duct appear normal. The 
gallbladder is surgically absent. 
2. Incidental findings include a stable thick-walled cavity in the 
posterolateral medial base of the RIGHT lower lobe of the lung and a small LEFT 
renal cyst. 
3. No acute appearing abnormalities are found in the abdomen and there is no 
evidence for abdominal malignancy.

## 2022-07-20 ENCOUNTER — Telehealth: Payer: Self-pay | Admitting: Primary Care

## 2022-07-20 NOTE — Telephone Encounter (Signed)
[facility]    Schedule [facility] for 2023.  (11/14 and 12/12 have been opened for this.)

## 2022-07-26 NOTE — Telephone Encounter (Addendum)
[facility] - 2nd attempt    Offered 11/14.  12/12 is already full.

## 2022-12-07 IMAGING — MR MRI LUMBAR SPINE WITHOUT CONTRAST
6 of 9 series · 15 of 48 positions shown · IV contrast (gadolinium)
Comparison: None.

________________________________________________________________________________________________ 
MRI LUMBAR SPINE WITHOUT CONTRAST, 12/07/2022 [DATE]: 
CLINICAL INDICATION: 5 month history of low back pain that radiates to left 
buttocks.
TECHNIQUE: Multiplanar, multiecho position MR images of the lumbar spine were 
performed without intravenous gadolinium enhancement. Patient was scanned on a 
3T magnet.

[Series 101: survey · axial · 10.0mm · 1.39mm/px · 1 of 9 slices shown]
[im 1/9]
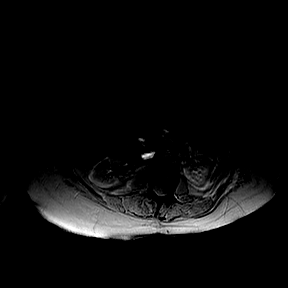

[Series 201: t2w_cor-surv · coronal · 6.0mm · 0.50mm/px · 1 of 5 slices shown]
[im 1/5]
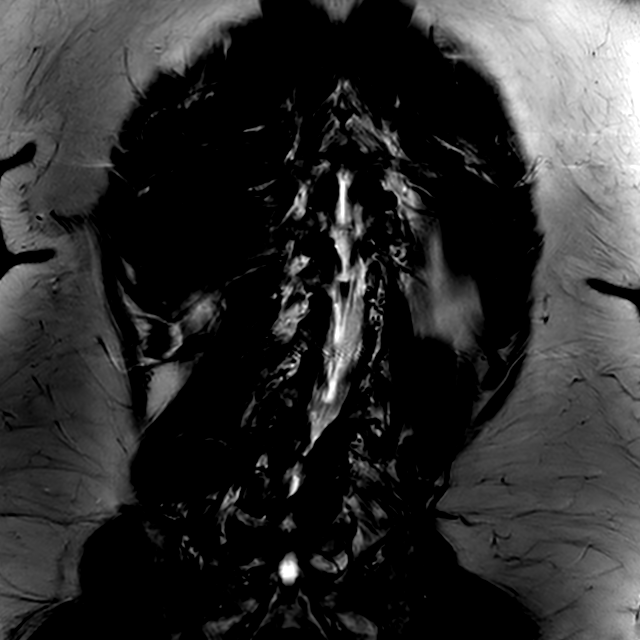

[Series 301: t1w_tse sag · sagittal · 4.0mm · 0.26mm/px · 1 of 17 slices shown]
[im 1/17]
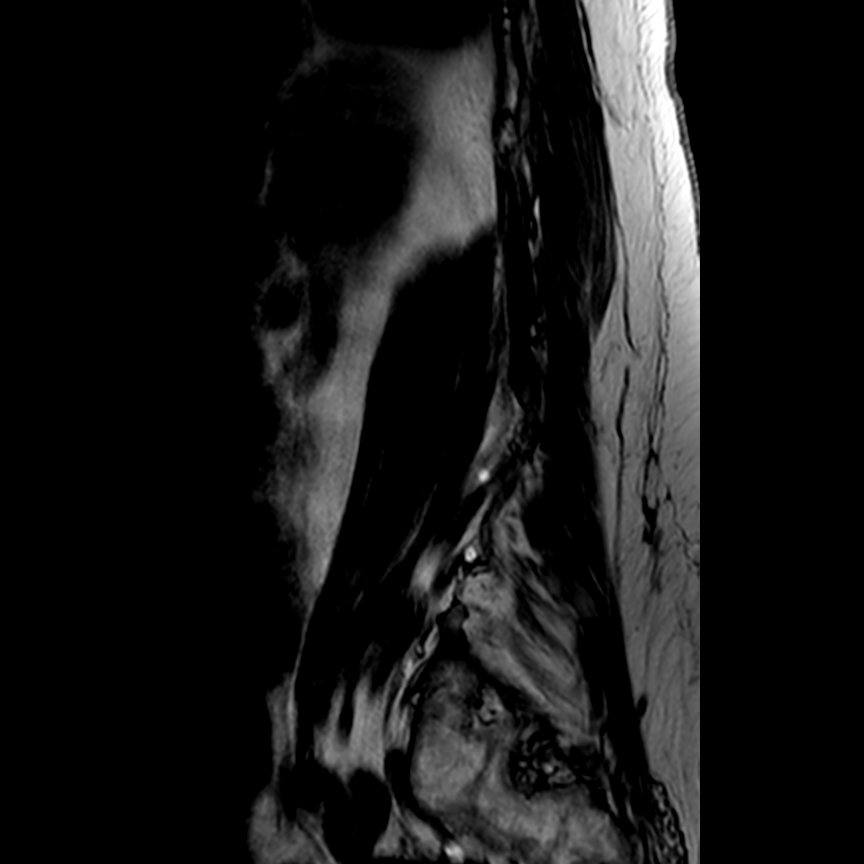

[Series 402: sst2 sag/fs · sagittal · 4.0mm · 0.47mm/px · 2 of 17 slices shown]
[im 1/17]
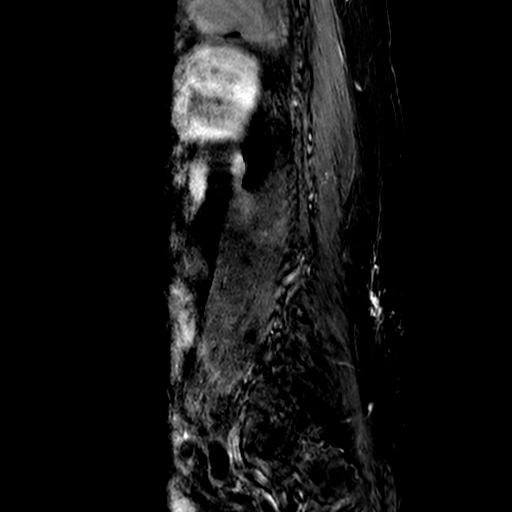
[im 17/17]
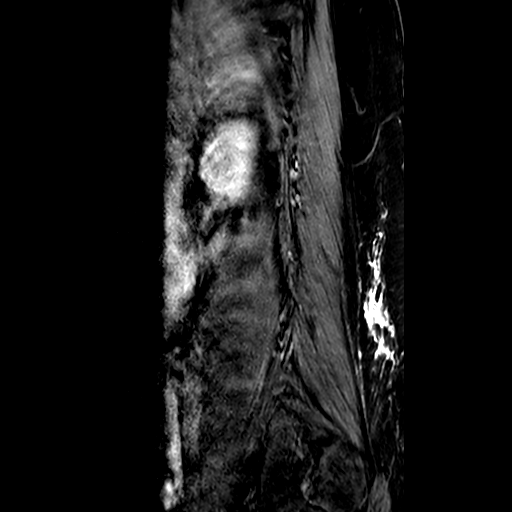

[Series 403: st2 sag · sagittal · 4.0mm · 0.47mm/px · 2 of 17 slices shown]
[im 1/17]
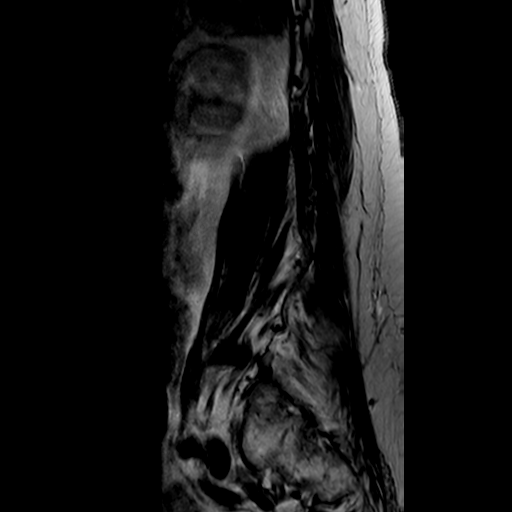
[im 17/17]
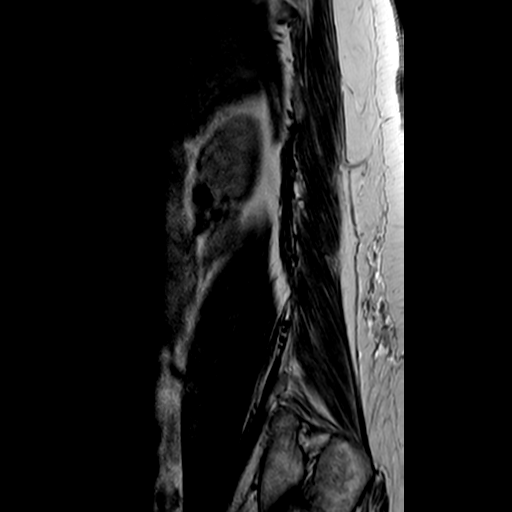

[Series 501: 3d_spine_view_t2w sag · sagittal · 1.5mm · 0.47mm/px · 8 of 107 slices shown]
[im 1/107]
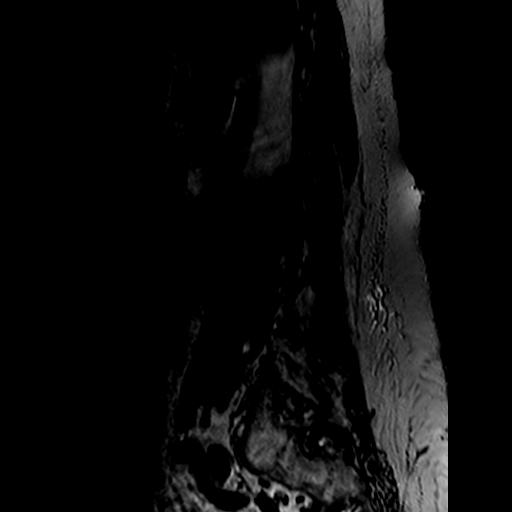
[im 18/107]
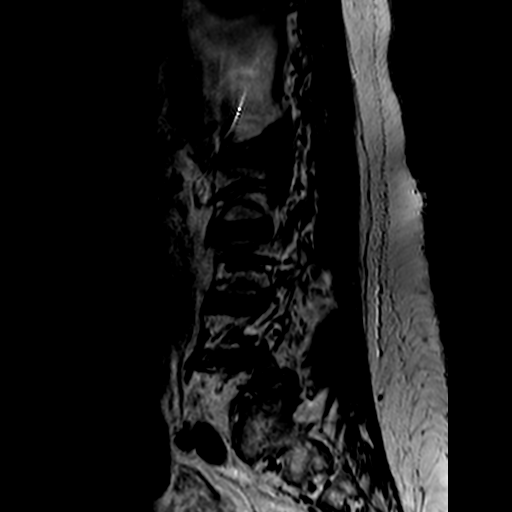
[im 36/107]
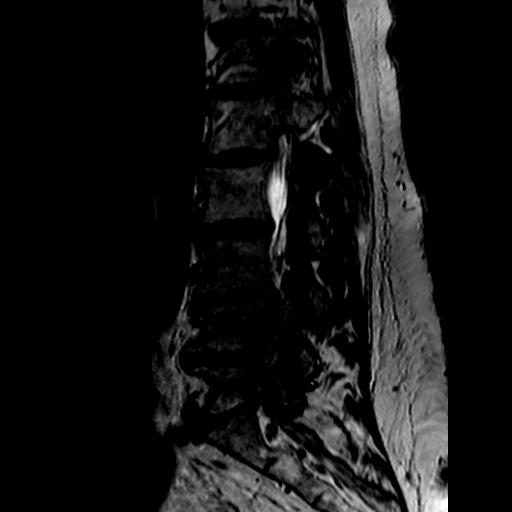
[im 45/107]
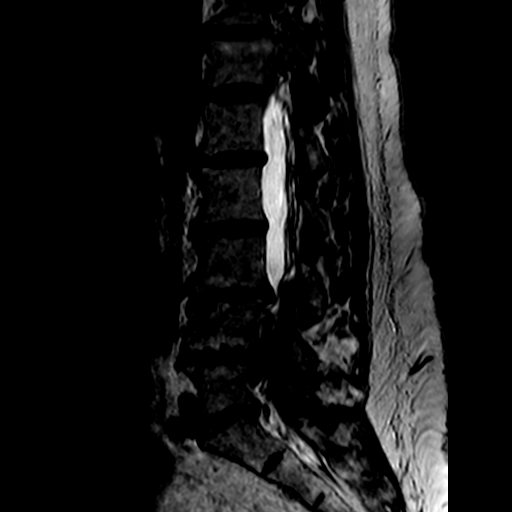
[im 62/107]
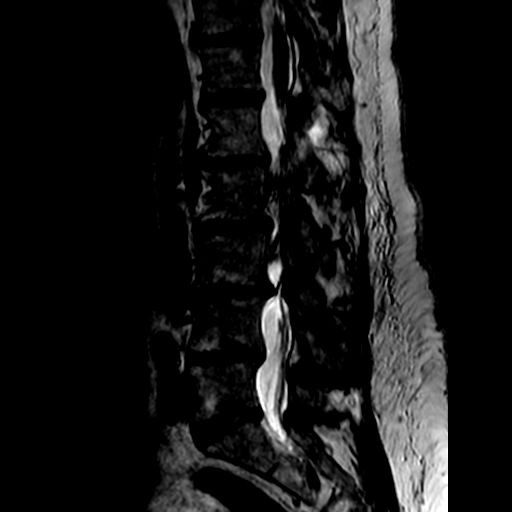
[im 71/107]
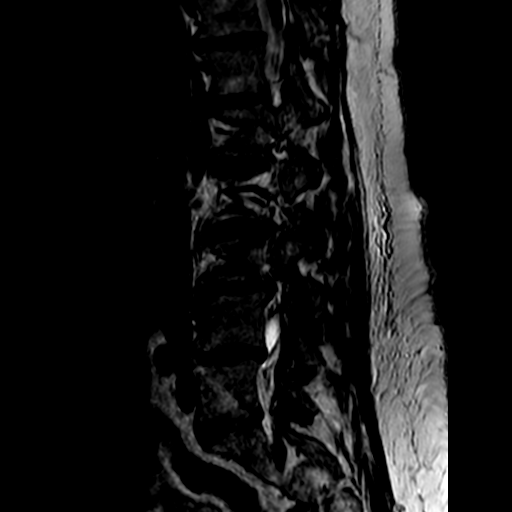
[im 89/107]
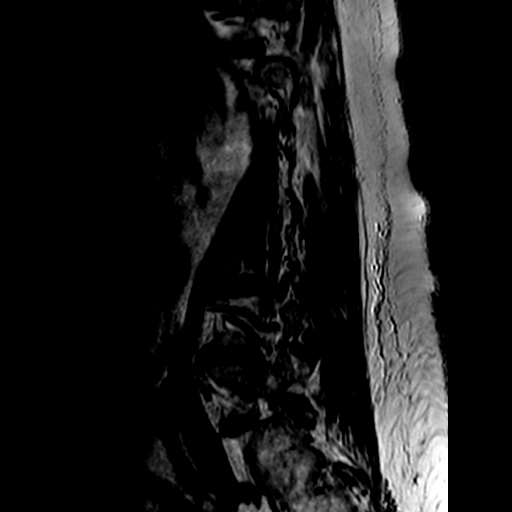
[im 107/107]
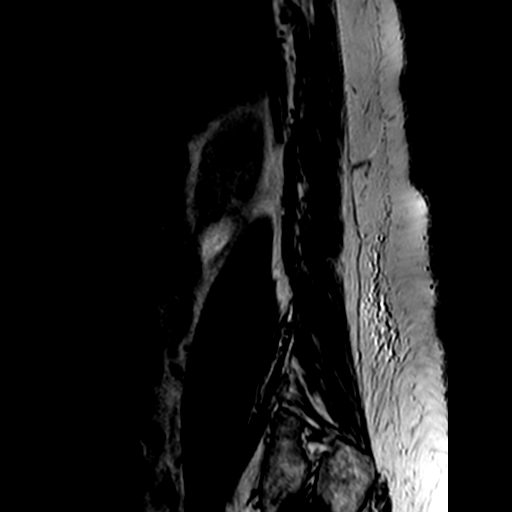

[15 of 48 positions shown; findings below may reference images not displayed]

FINDINGS: -------------------------------------------------------------------------------- 
------ 
GENERAL: 
Nomenclature is based on 5 lumbar type vertebral bodies.     
ALIGNMENT: Leftward curvature of the thoracolumbar junction. Left lateral 
subluxation of L3 on L4. Mild retrolisthesis of L3 on L4, L4 on L5, L5 on S1. 
VERTEBRAL BODY HEIGHT: Normal.  
MARROW SIGNAL: No focal suspect signal abnormality. 
CORD SIGNAL: Normal distal spinal cord and cauda equina.  Conus terminates at 
L1-L2 level. 
ADDITIONAL FINDINGS: None. 
Modic I-II: L3-L4, L4-L5, L5-S1 
Ligamentum Flavum > 2.5 mm: All levels. 
-------------------------------------------------------------------------------- 
------ 
SEGMENTAL: 
T12-L1: Bilateral facet hypertrophy. Mild disc bulge. No significant central 
canal narrowing.  No significant right neural foraminal narrowing. No 
significant left neural foraminal narrowing.  
L1-L2: Disc bulge. No significant central canal narrowing.  No significant right 
neural foraminal narrowing. No significant left neural foraminal narrowing.  
L2-L3: Disc bulge. Bilateral facet hypertrophy. No significant central canal 
narrowing.  No significant right neural foraminal narrowing. No significant left 
neural foraminal narrowing.  
L3-L4: Disc bulge. Bilateral facet and ligamentum flavum hypertrophy. Mild 
central narrowing. Moderate bilateral subarticular recess narrowing.  Mild right 
neural foraminal narrowing. No significant left neural foraminal narrowing.  
L4-L5: Disc bulge eccentric to the left side. Left foraminal disc herniation. 
Left facet hypertrophy. No significant central canal narrowing. Mild right and 
severe left subarticular recess narrowing.  No significant right neural 
foraminal narrowing. Severe left neural foraminal narrowing.  
L5-S1: Disc bulge eccentric to the left with left greater than right facet 
hypertrophy. No significant central canal narrowing.  Mild right neural 
foraminal narrowing. Severe left neural foraminal narrowing.  
-------------------------------------------------------------------------------- 
------
IMPRESSION: 1.  Discogenic/degenerative changes as above. 
2.  Worst level(s) of central canal/subarticular recess narrowing: L4-L5 (severe 
left subarticular recess narrowing) 
3.  Worst level(s) of neural foraminal narrowing: L4-L5 (severe left), L5-S1 
(severe left).

## 2022-12-07 IMAGING — CT CT CHEST WITHOUT CONTRAST
2 of 4 series · 15 of 36 positions shown, 18 images · non-contrast
Comparison: CT 08/09/2021.

________________________________________________________________________________________________ 
******** ADDENDUM #1 ********/n 
In patients between the ages of 50-77, where pulmonary emphysema is noted on CT, 
recommend evaluation for low dose lung cancer screening protocol if patient is 
not already enrolled; as pulmonary emphysema is an independent risk factor for 
lung cancer. 
CT CHEST WITHOUT CONTRAST, 12/07/2022 [DATE]: 
CLINICAL INDICATION: Emphysema and chronic cough. 
A search for DICOM formatted images was conducted for prior CT imaging studies 
completed at a non-affiliated media free facility.
TECHNIQUE: The chest was scanned from base of neck through the lung bases 
without contrast on a high resolution low dose CT scanner. Routine MPR and MIP 
reconstruction images were performed. Count of known CT and Cardiac Nuclear 
Medicine studies performed in the previous 12 months = < 0.>

[Series 2: chest 2.0 i31s 3 · axial · 0.63mm/px · z∈[-306,-14]mm · 12 of 162 slices shown, 15 images]
[im 8/162  mediastinal]
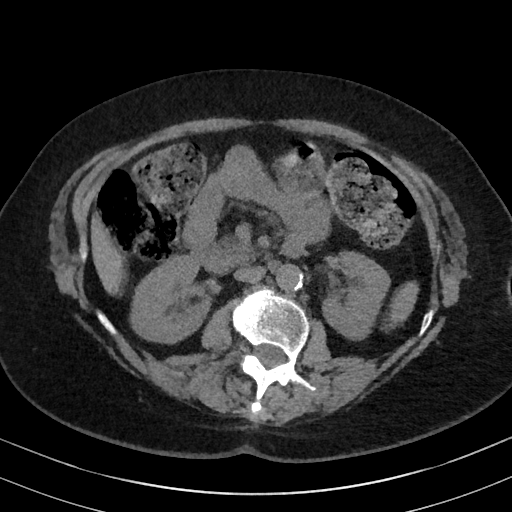
[im 8/162  lung]
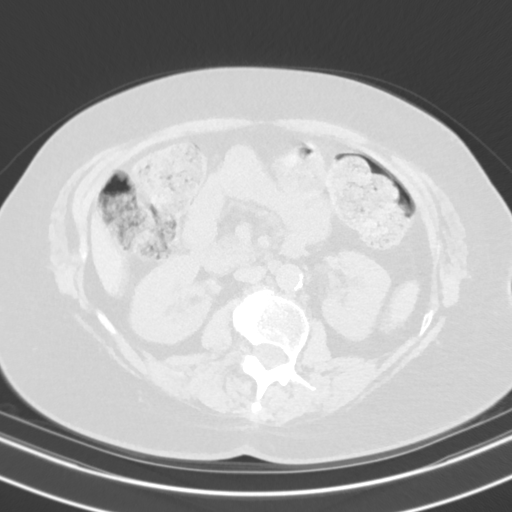
[im 24/162  lung]
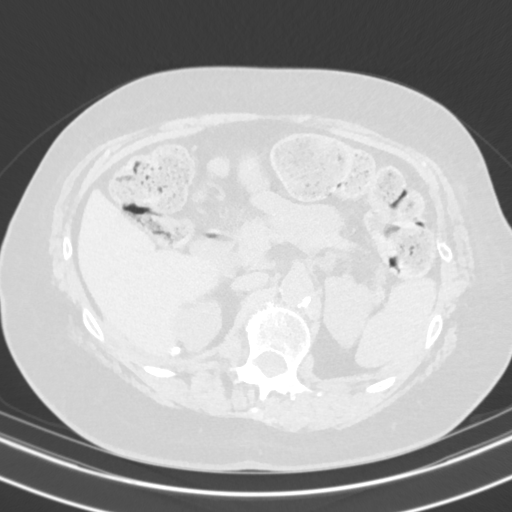
[im 39/162  lung]
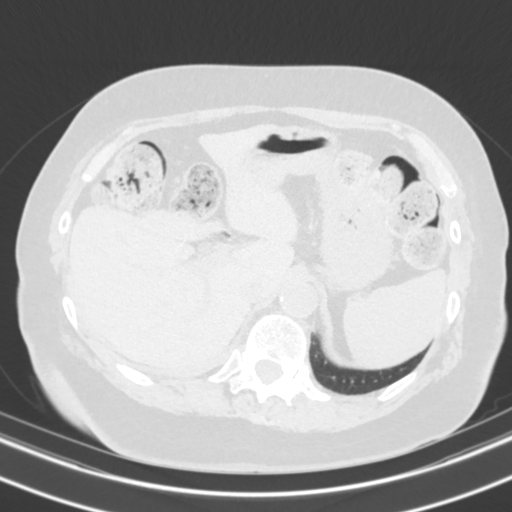
[im 47/162  lung]
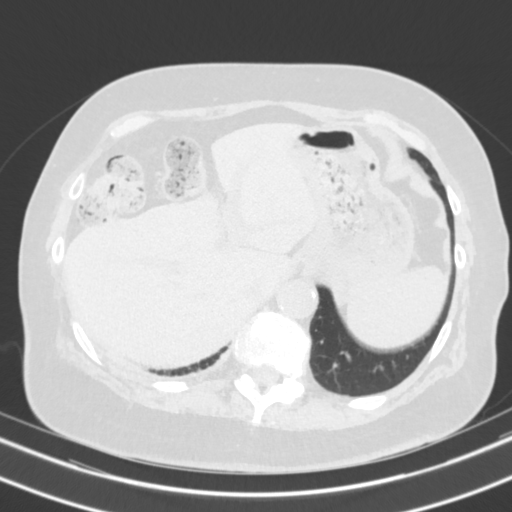
[im 62/162  mediastinal]
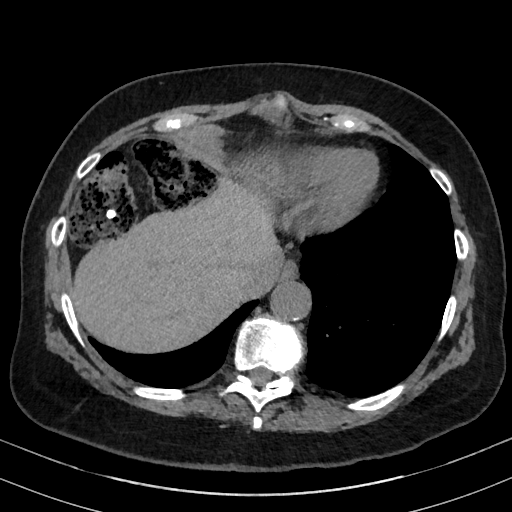
[im 62/162  lung]
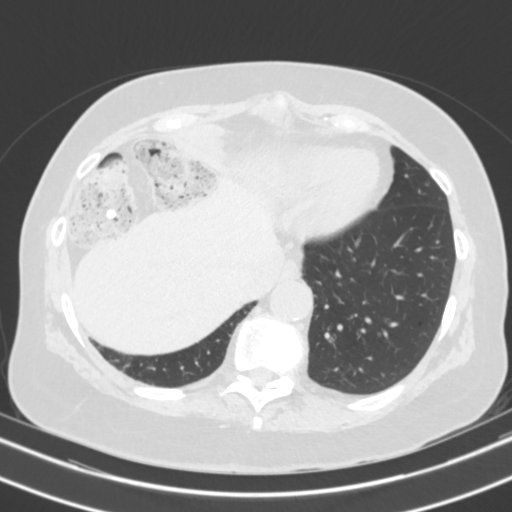
[im 77/162  lung]
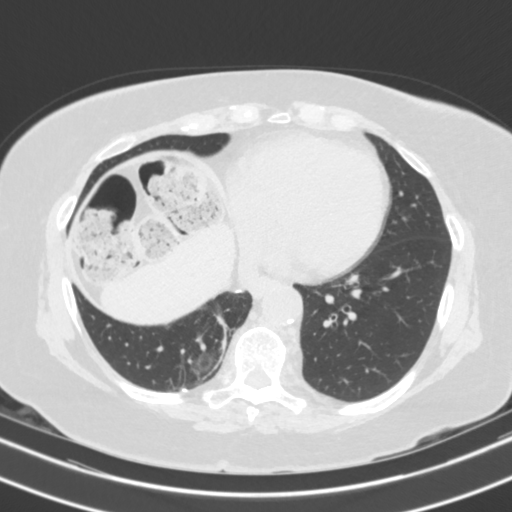
[im 85/162  lung]
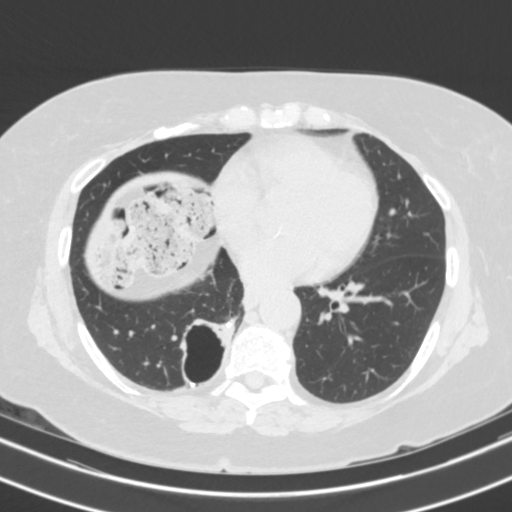
[im 100/162  lung]
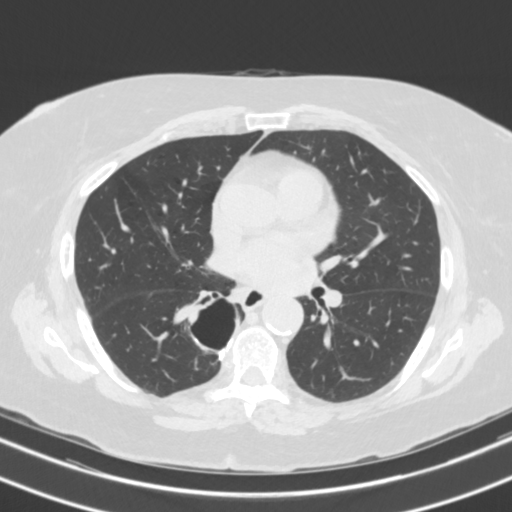
[im 116/162  mediastinal]
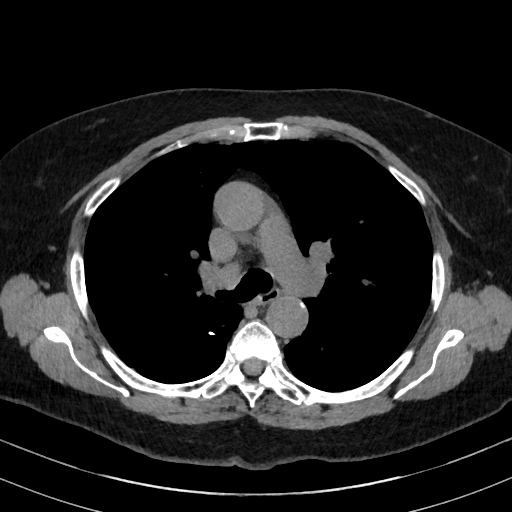
[im 116/162  lung]
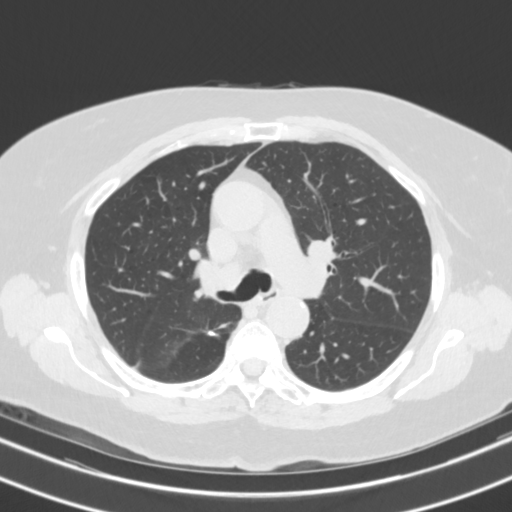
[im 123/162  lung]
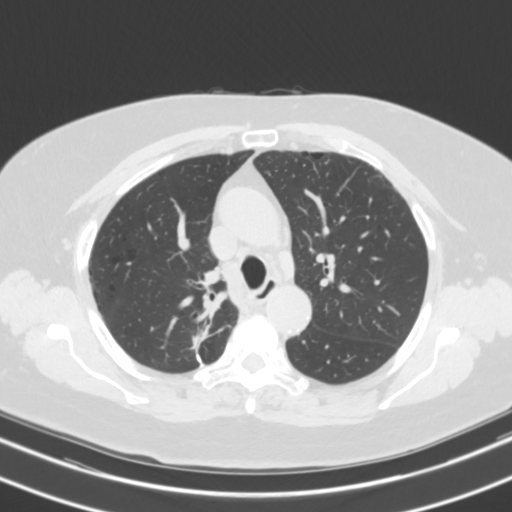
[im 139/162  lung]
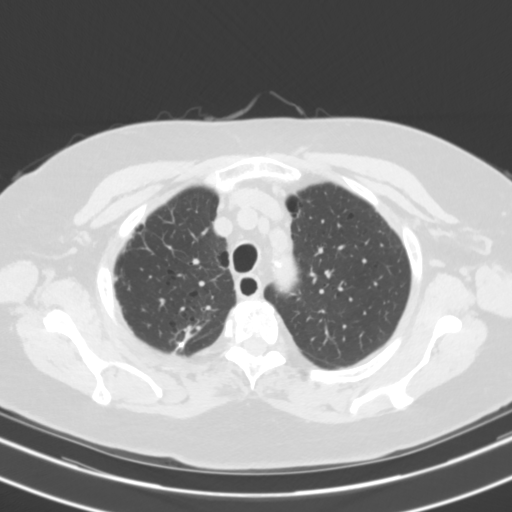
[im 154/162  lung]
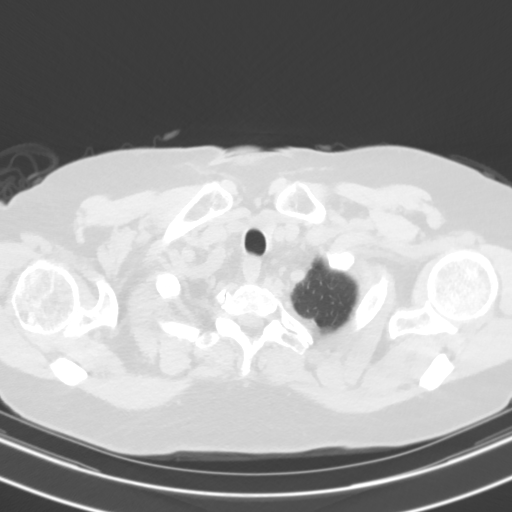

[Series 4: coronal · coronal · 0.63mm/px · 3 of 138 slices shown]
[im 28/138  lung]
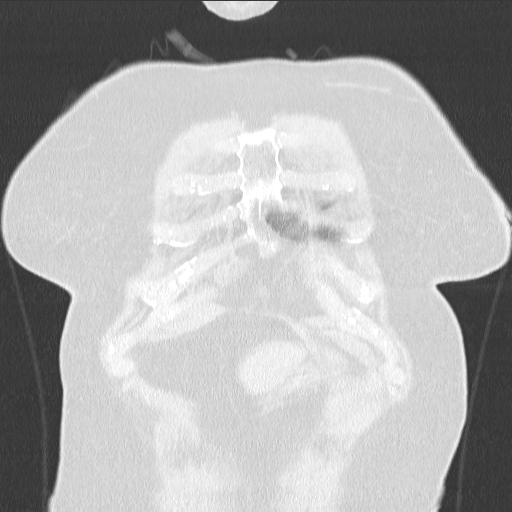
[im 55/138  lung]
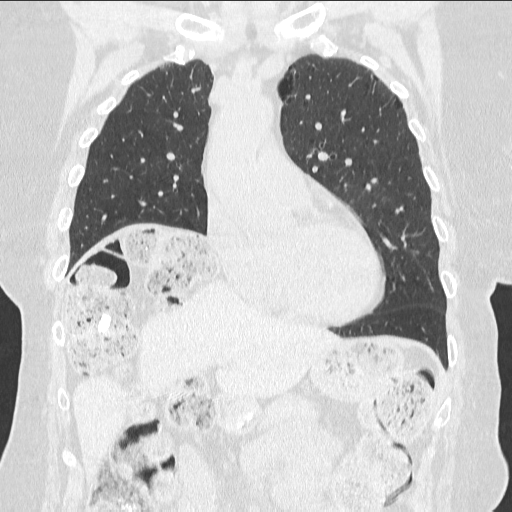
[im 83/138  lung]
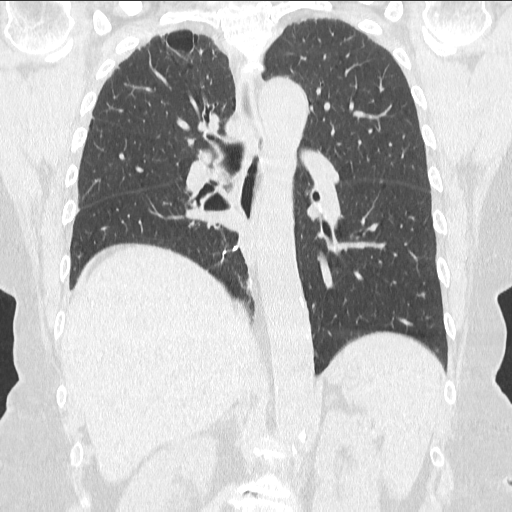

[15 of 36 positions shown; findings below may reference images not displayed]

FINDINGS: LUNG BASES: Mild/moderate bullous disease. The largest bulla is within the right 
lower lobe ([DATE]) and demonstrates some wall thickening but no air-fluid 
level or fungus ball. Postsurgical change right upper lobe and right lower lobe. 
Mild peribronchial thickening and scattered areas of atelectasis. No 
consolidation or effusion. No mediastinal or hilar adenopathy. Elevation right 
hemidiaphragm. 
MEDIASTINUM: No mass or adenopathy. 
HEART: Normal configuration. No pericardial effusion. No significant coronary 
artery calcification 
ABDOMEN: No mass or biliary dilatation. Status post cholecystectomy. Small 
calcification posterior to the liver and inferior to the liver noted may 
represent dropped gallstones. Large amount of stool within the colon.
IMPRESSION: Mild/moderate bullous disease with a large bulla within the right lower lobe 
with associated postsurgical change. Stable mild wall thickening of this bulla 
but no evidence of air-fluid level or fungal ball. 
Peribronchial thickening with gadolinium areas of atelectasis but no acute 
consolidation. 
No mediastinal or hilar adenopathy. 
RADIATION DOSE REDUCTION: All CT scans are performed using radiation dose 
reduction techniques, when applicable.  Technical factors are evaluated and 
adjusted to ensure appropriate moderation of exposure.  Automated dose 
management technology is applied to adjust the radiation doses to minimize 
exposure while achieving diagnostic quality images.

## 2022-12-26 IMAGING — MG MAMMOGRAPHY SCREENING BILATERAL 3[PERSON_NAME]
8 series · 8 of 24 positions shown · non-contrast
Comparison: 03/28/2021

________________________________________________________________________________________________ 
MAMMOGRAPHY SCREENING BILATERAL 3PAANO DONOR, 12/26/2022 [DATE]: 
CLINICAL INDICATION: Encounter for screening mammogram.
TECHNIQUE: Digital bilateral mammograms and 3-D Tomosynthesis were obtained. 
These were interpreted both primarily and with the aid of computer-aided 
detection system.  
BREAST DENSITY: (Level B) There are scattered areas of fibroglandular density.

[L CC]
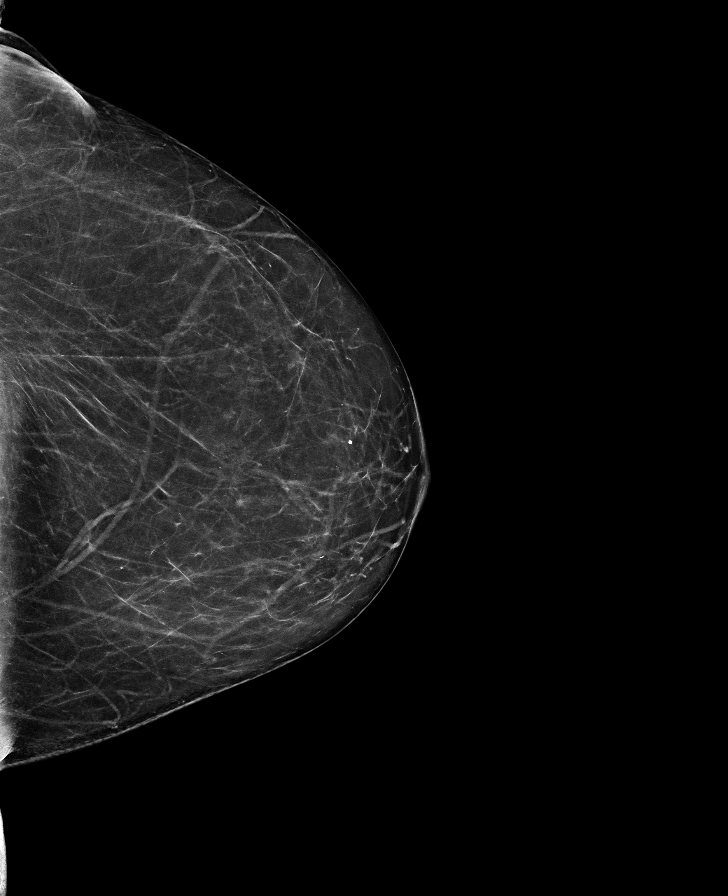

[R CC]
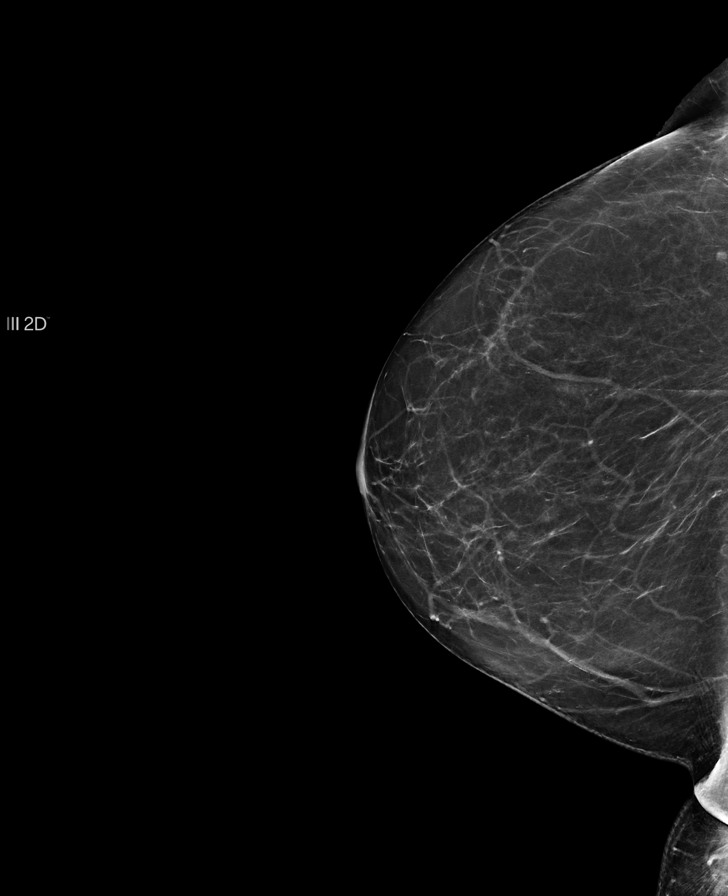

[R MLO]
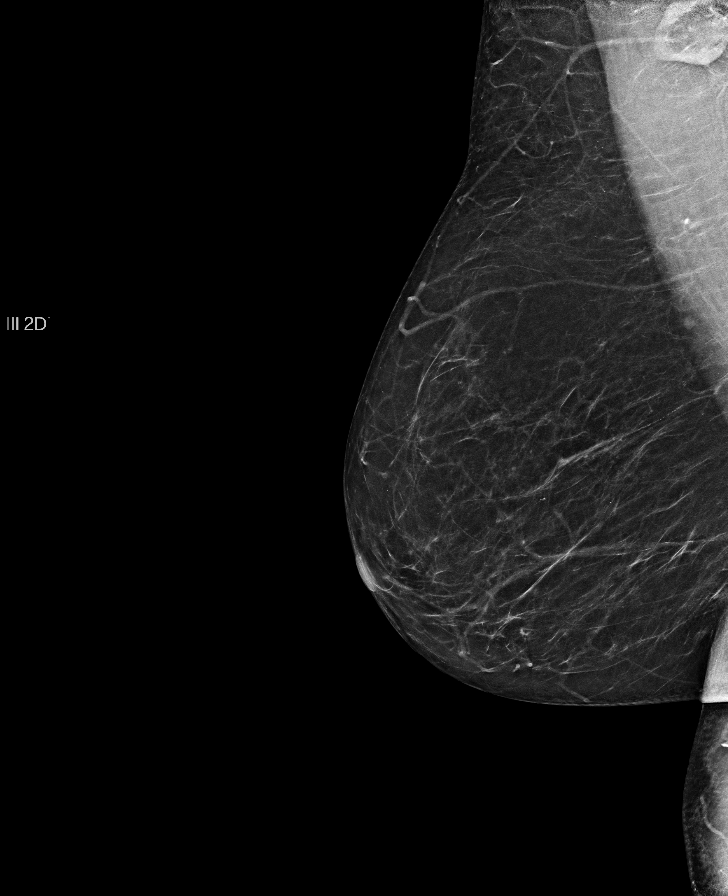

[L MLO]
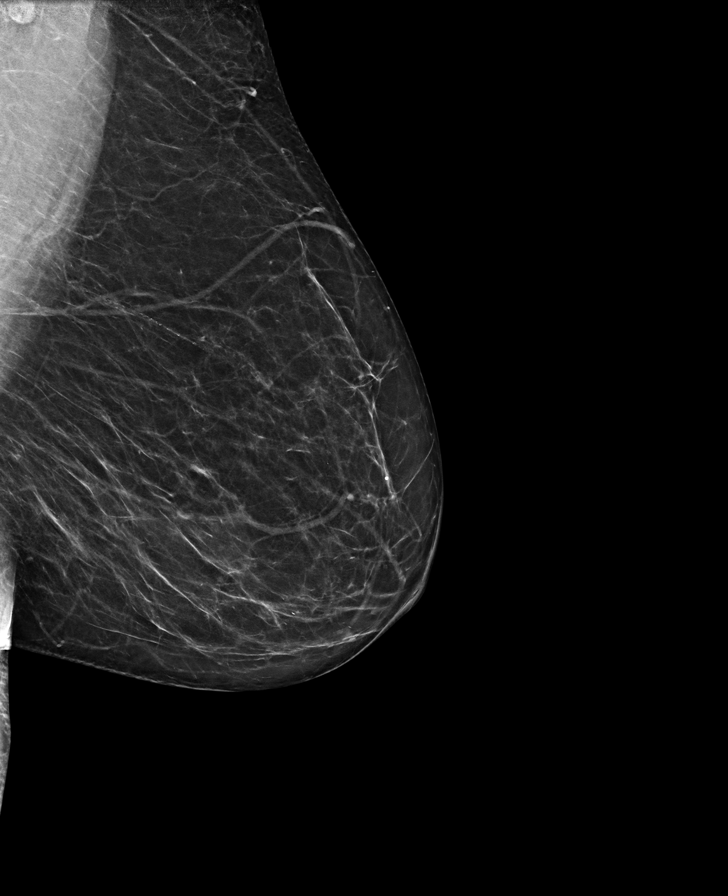

[L CC tomo · tomo slice 35/68.0]
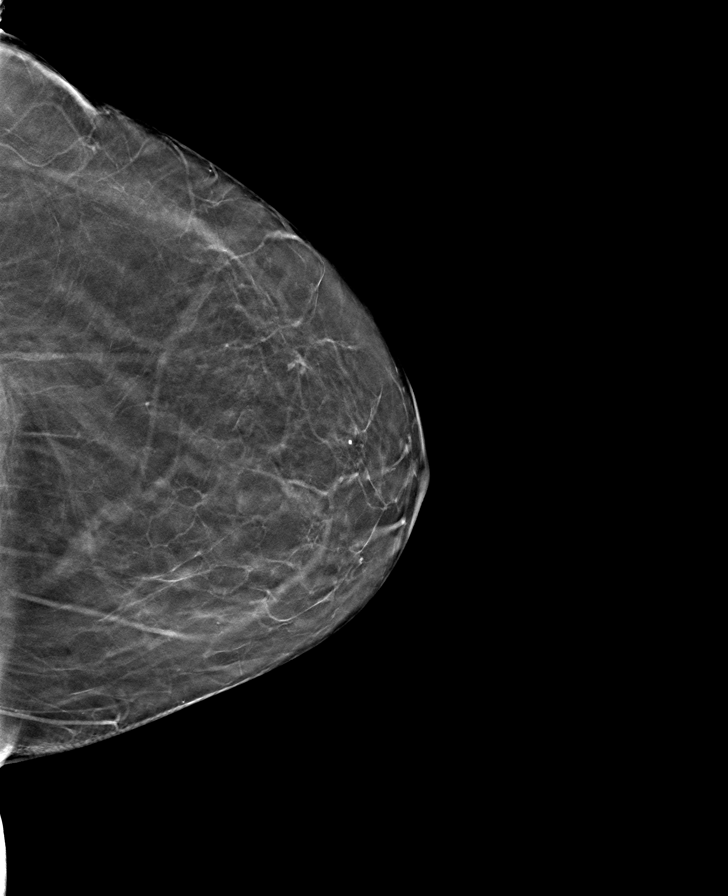

[R MLO tomo · tomo slice 33/65.0]
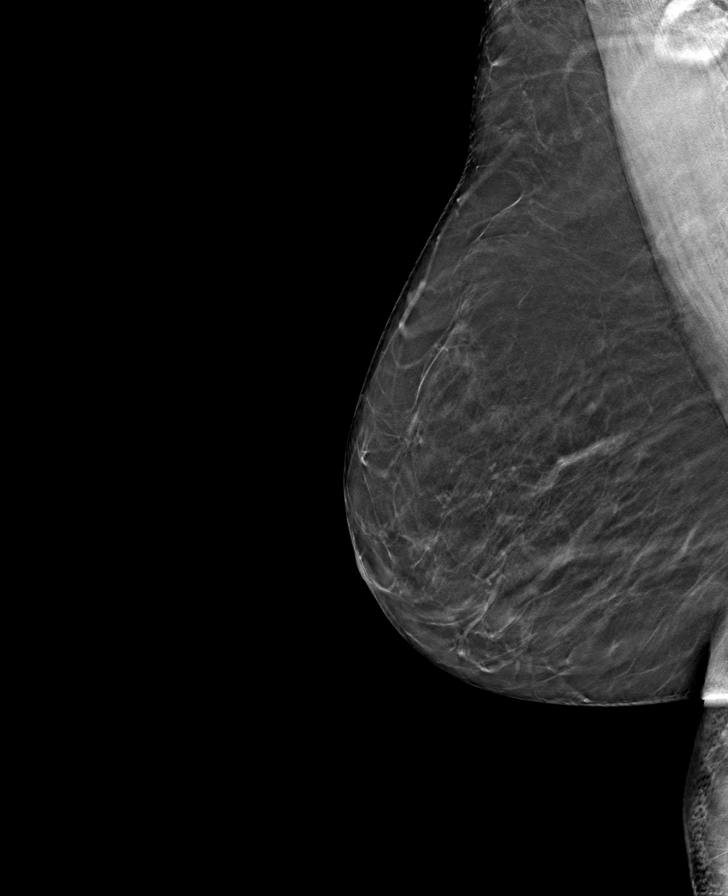

[L MLO tomo · tomo slice 33/64.0]
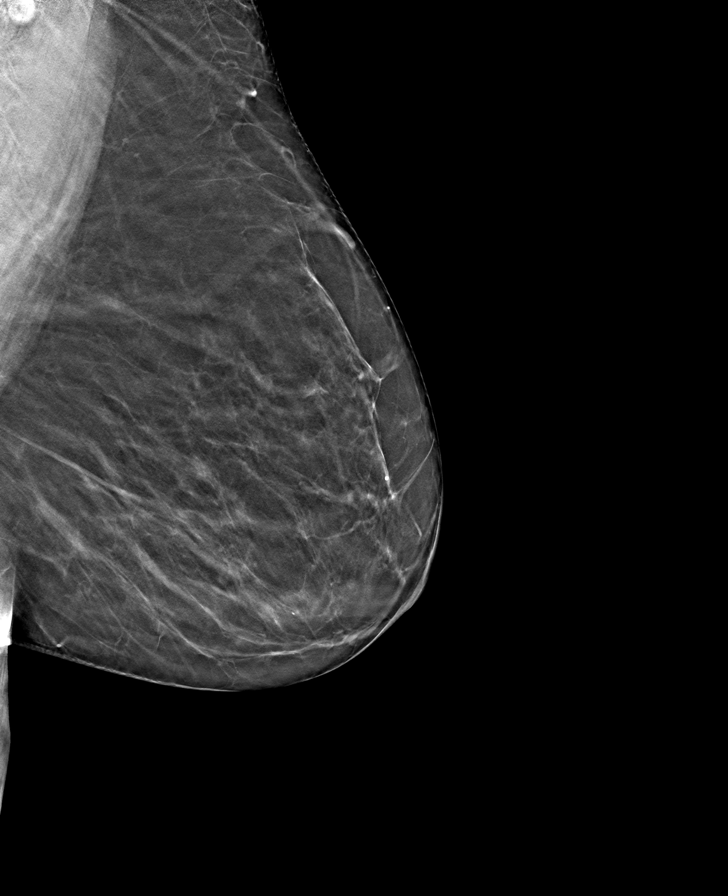

[R CC tomo · tomo slice 33/65.0]
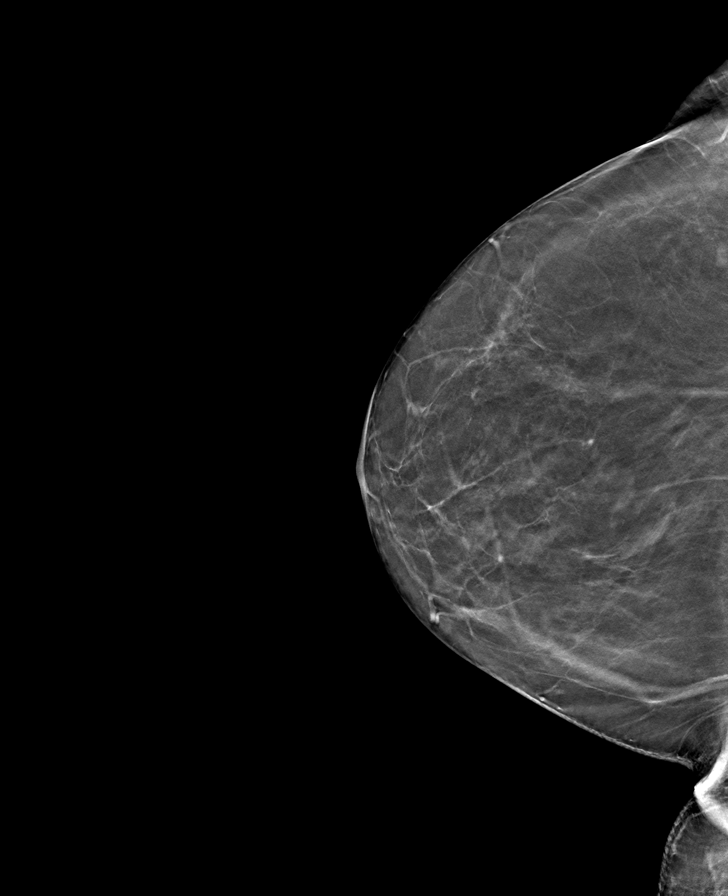

[8 of 24 positions shown; findings below may reference images not displayed]

FINDINGS: No suspicious mass, calcifications, or area of architectural 
distortion in either breast. Overall stable mammographic appearance.
IMPRESSION: No mammographic findings suggestive for malignancy. 
(BI-RADS 2) Benign findings. Routine mammographic follow-up is recommended.

## 2022-12-26 IMAGING — DX LUMBAR SPINE COMPLETE 4 VIEWS
1 series · 4 of 4 positions shown · non-contrast
Comparison: MRI lumbar spine December 07, 2022.

________________________________________________________________________________________________ 
LUMBAR SPINE COMPLETE 4 VIEWS, 12/26/2022 [DATE]: 
CLINICAL INDICATION: Lesion Of Sciatic Nerve, Left Lower Limb.

[Series 1: AP · U · 0.14mm/px · 4 of 4 slices shown]
[im 1/4]
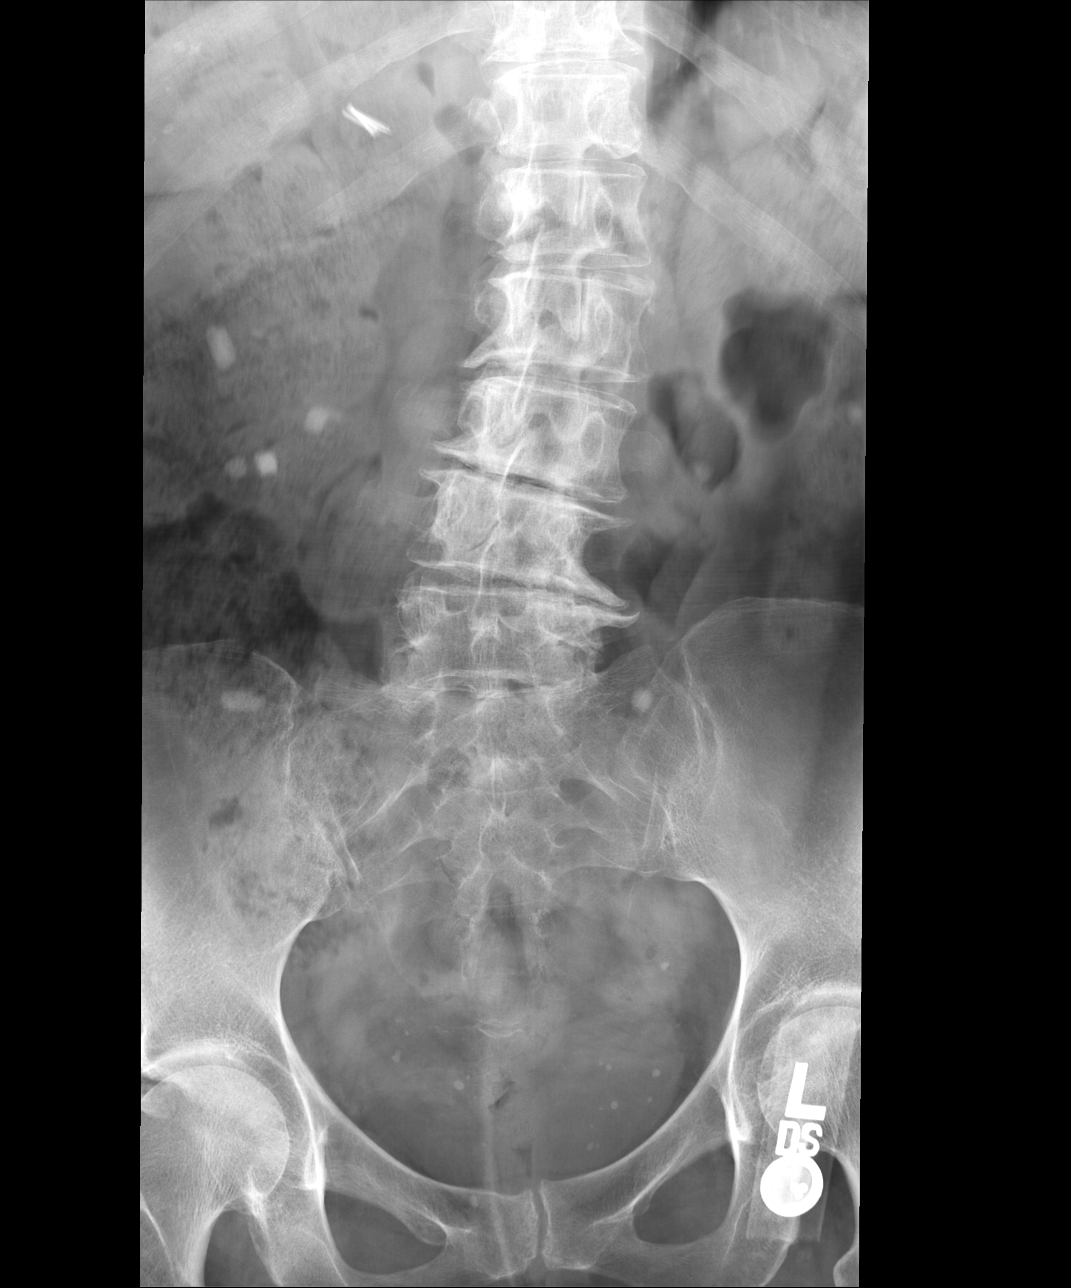
[im 2/4]
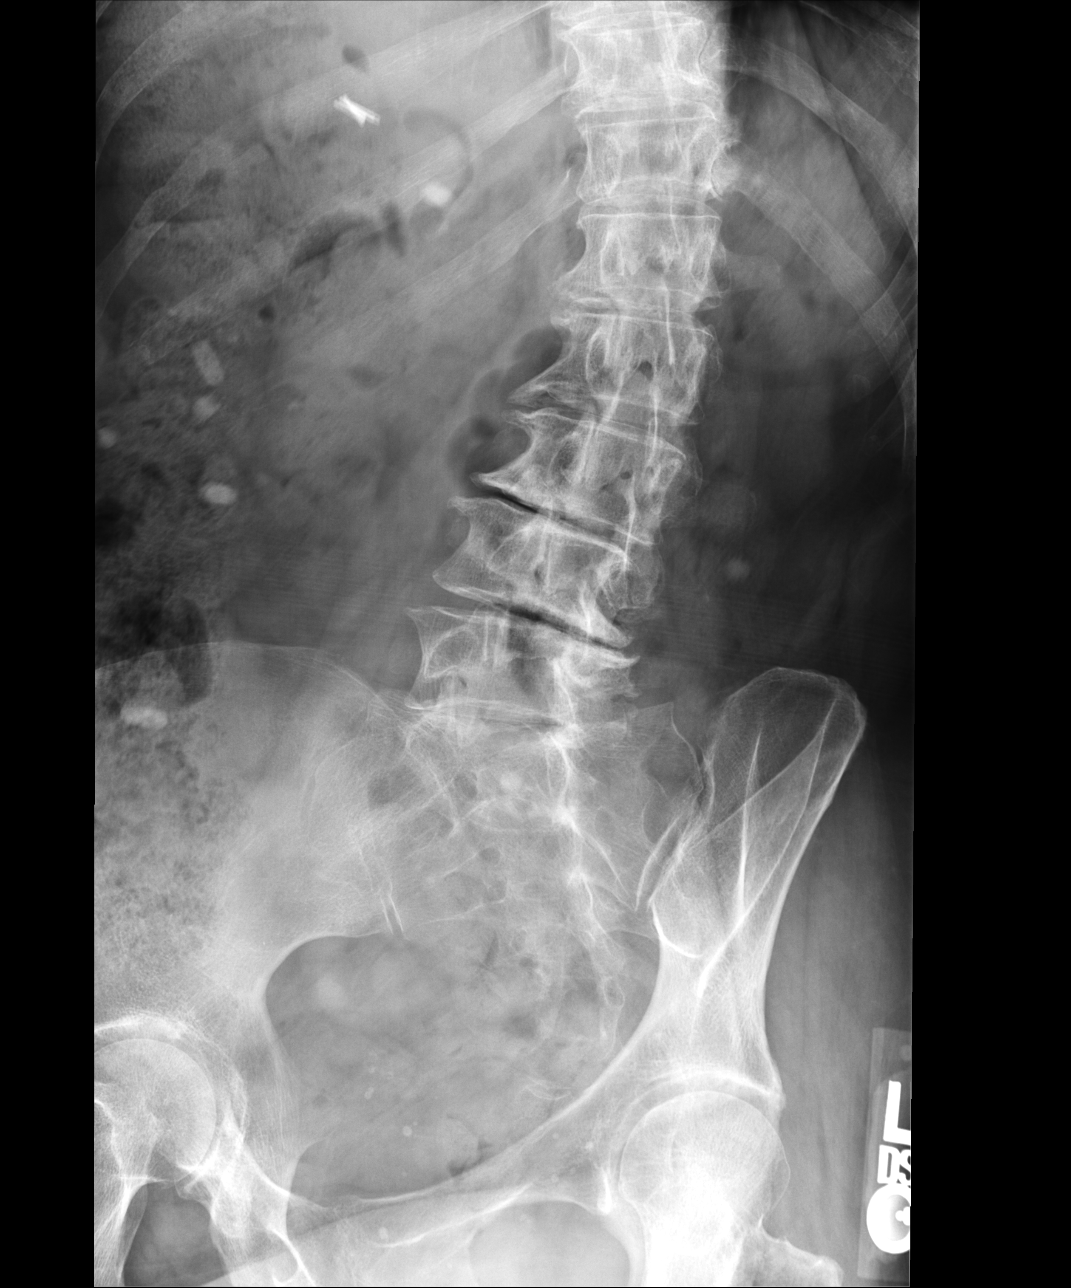
[im 3/4]
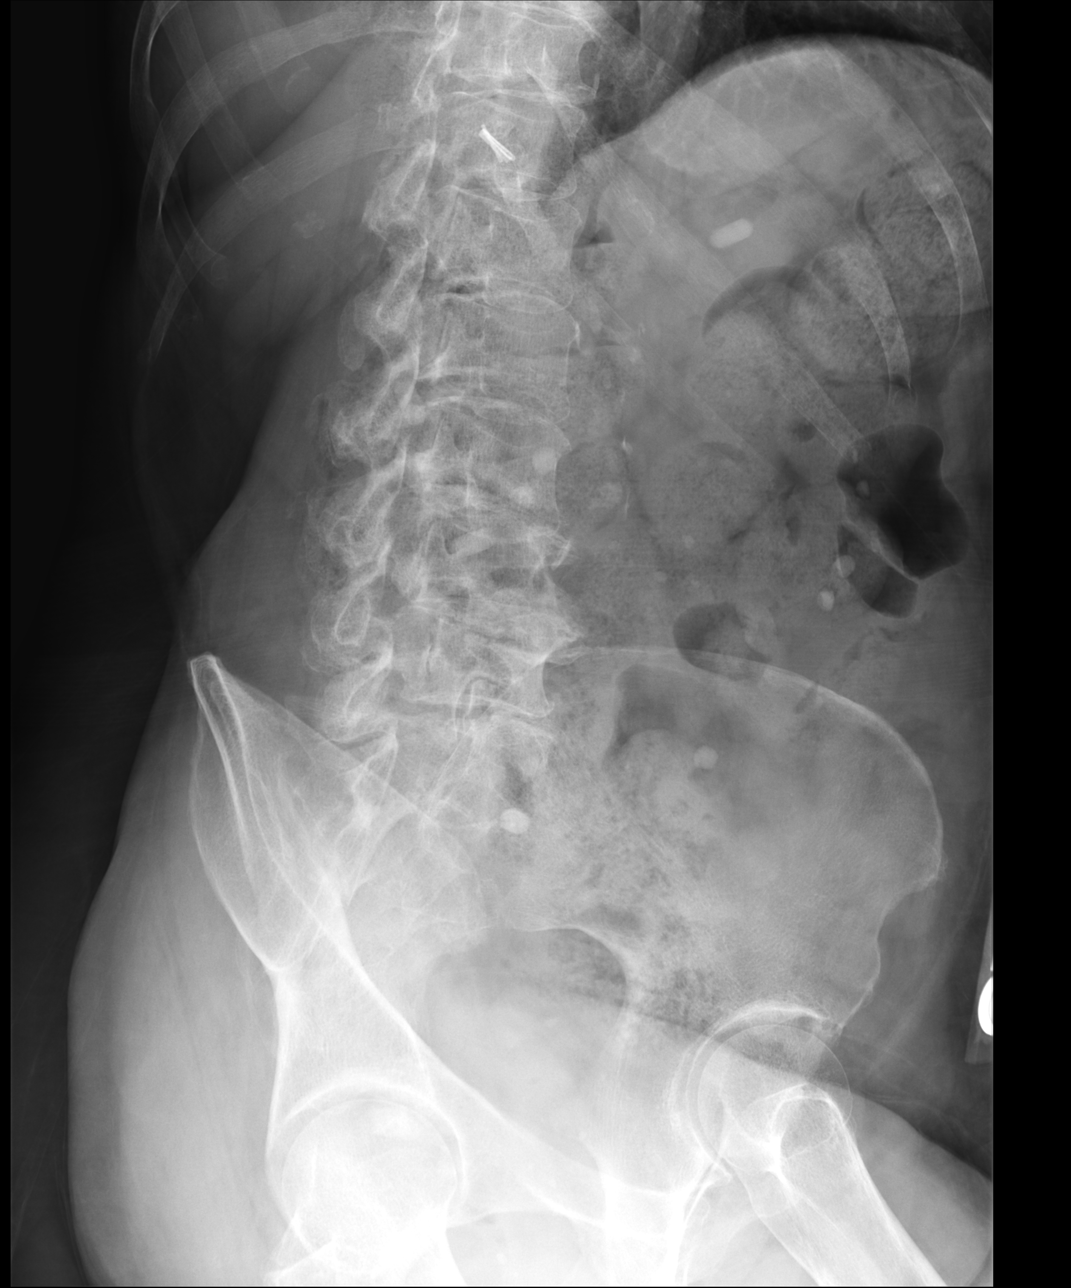
[im 4/4]
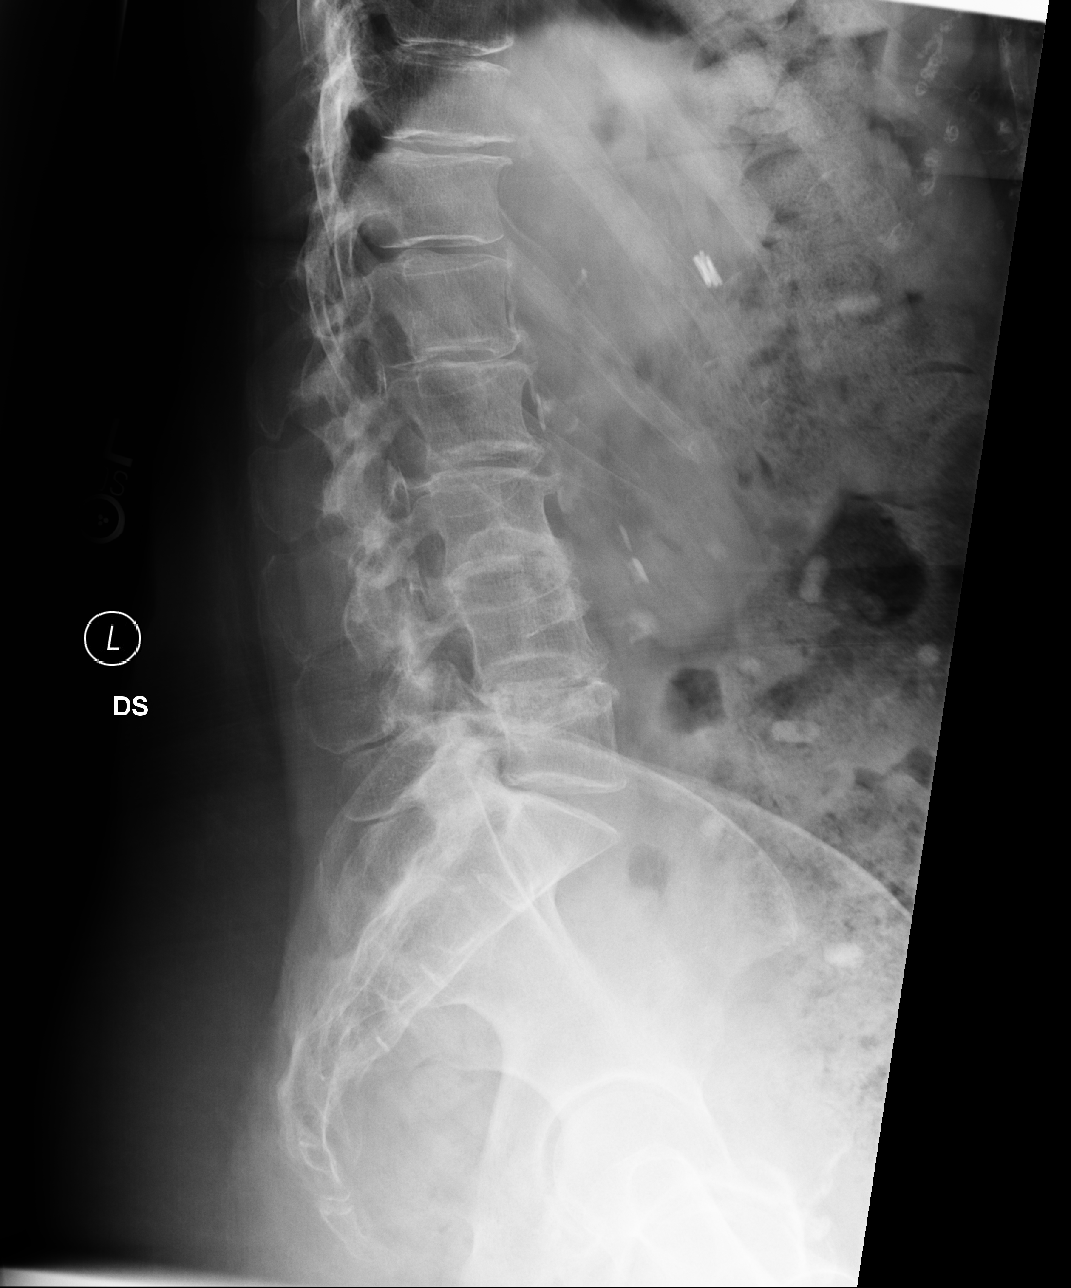

[4 of 4 positions shown; findings below may reference images not displayed]

FINDINGS: Mild levoconvex thoracic lumbar scoliosis. No pars defect. Loss of the 
normal lumbar lordosis with straightening of the lumbar alignment. 3.5 mm 
retrolisthesis L5 on S1. Vertebral body height preserved. No fracture is 
suspected with multilevel degenerative disc disease changes. Multilevel lumbar 
facet arthropathy. There are prominent left lateral osteophytes L4-L5 with 
smaller appearing right laterally directed osteophytes L2-L3 and L3-L4. No lytic 
or sclerotic bone lesion is suspected. 
Numerous suspected tablets within bowel. Right upper quadrant surgical clips. 
Mild aortic atherosclerotic changes. Pelvic phleboliths. Moderate stool burden.
IMPRESSION: Degenerative and scoliotic changes as detailed above.

## 2023-05-22 NOTE — Telephone Encounter (Signed)
 Letter returned no forwarding address

## 2023-11-24 ENCOUNTER — Emergency Department
Admission: EM | Admit: 2023-11-24 | Discharge: 2023-11-24 | Disposition: A | Discharge status: Home / Self Care | Payer: Medicare (Managed Care) | Source: Ambulatory Visit | Attending: Palliative Care | Admitting: Palliative Care

## 2023-11-24 ENCOUNTER — Emergency Department: Payer: Medicare (Managed Care)

## 2023-11-24 ENCOUNTER — Other Ambulatory Visit: Payer: Self-pay

## 2023-11-24 DIAGNOSIS — Z1159 Encounter for screening for other viral diseases: Secondary | ICD-10-CM | POA: Insufficient documentation

## 2023-11-24 DIAGNOSIS — E876 Hypokalemia: Secondary | ICD-10-CM | POA: Insufficient documentation

## 2023-11-24 DIAGNOSIS — J101 Influenza due to other identified influenza virus with other respiratory manifestations: Secondary | ICD-10-CM | POA: Insufficient documentation

## 2023-11-24 DIAGNOSIS — R0600 Dyspnea, unspecified: Secondary | ICD-10-CM

## 2023-11-24 DIAGNOSIS — J449 Chronic obstructive pulmonary disease, unspecified: Secondary | ICD-10-CM

## 2023-11-24 DIAGNOSIS — I444 Left anterior fascicular block: Secondary | ICD-10-CM

## 2023-11-24 DIAGNOSIS — Z87891 Personal history of nicotine dependence: Secondary | ICD-10-CM | POA: Insufficient documentation

## 2023-11-24 DIAGNOSIS — Z1152 Encounter for screening for COVID-19: Secondary | ICD-10-CM | POA: Insufficient documentation

## 2023-11-24 DIAGNOSIS — R000 Tachycardia, unspecified: Secondary | ICD-10-CM

## 2023-11-24 LAB — CBC AND DIFFERENTIAL
Baso # K/uL: 0 10*3/uL (ref 0.0–0.2)
Eos # K/uL: 0 10*3/uL (ref 0.0–0.5)
Hematocrit: 38 % (ref 34–49)
Hemoglobin: 12.3 g/dL (ref 11.2–16.0)
IMM Granulocytes #: 0 10*3/uL (ref 0.0–0.0)
IMM Granulocytes: 0.2 %
Lymph # K/uL: 0.7 10*3/uL — ABNORMAL LOW (ref 1.0–5.0)
MCV: 83 fL (ref 75–100)
Mono # K/uL: 0.3 10*3/uL (ref 0.1–1.0)
Neut # K/uL: 5.1 10*3/uL (ref 1.5–6.5)
Nucl RBC # K/uL: 0 10*3/uL (ref 0.0–0.0)
Nucl RBC %: 0 /100{WBCs} (ref 0.0–0.2)
Platelets: 217 10*3/uL (ref 150–450)
RBC: 4.6 MIL/uL (ref 4.0–5.5)
RDW: 14.1 % (ref 0.0–15.0)
Seg Neut %: 82.3 %
WBC: 6.2 10*3/uL (ref 3.5–11.0)

## 2023-11-24 LAB — COVID/INFLUENZA A & B/RSV NAAT (PCR)
COVID-19 NAAT (PCR): NEGATIVE
Influenza A NAAT (PCR): POSITIVE — AB
Influenza B NAAT (PCR): NEGATIVE
RSV NAAT (PCR): NEGATIVE

## 2023-11-24 LAB — COMPREHENSIVE METABOLIC PANEL
ALT: 27 U/L (ref 0–35)
AST: 49 U/L — ABNORMAL HIGH (ref 0–35)
Albumin: 4.1 g/dL (ref 3.5–5.2)
Alk Phos: 113 U/L — ABNORMAL HIGH (ref 35–105)
Anion Gap: 15 (ref 7–16)
Bilirubin,Total: 0.3 mg/dL (ref 0.0–1.2)
CO2: 24 mmol/L (ref 20–28)
Calcium: 8.9 mg/dL (ref 8.6–10.2)
Chloride: 100 mmol/L (ref 96–108)
Creatinine: 0.66 mg/dL (ref 0.51–0.95)
Glucose: 163 mg/dL — ABNORMAL HIGH (ref 60–99)
Lab: 6 mg/dL (ref 6–20)
Potassium: 2.7 mmol/L — CL (ref 3.3–4.6)
Sodium: 139 mmol/L (ref 133–145)
Total Protein: 6.8 g/dL (ref 6.3–7.7)
eGFR BY CREAT: 92 *

## 2023-11-24 LAB — PERFORMING LAB

## 2023-11-24 MED ORDER — MAGNESIUM SULFATE 2 GM IN 50 ML *WRAPPED*
2000.0000 mg | Freq: Once | INTRAVENOUS | Status: AC
Start: 2023-11-24 — End: 2023-11-24
  Administered 2023-11-24: 2000 mg via INTRAVENOUS
  Filled 2023-11-24: qty 50

## 2023-11-24 MED ORDER — OSELTAMIVIR PHOSPHATE 75 MG PO CAPS *I*
75.0000 mg | ORAL_CAPSULE | Freq: Once | ORAL | Status: AC
Start: 2023-11-24 — End: 2023-11-24
  Administered 2023-11-24: 75 mg via ORAL
  Filled 2023-11-24: qty 1

## 2023-11-24 MED ORDER — POTASSIUM CHLORIDE CR 10 MEQ PO TBCR *A*
10.0000 meq | ORAL_TABLET | Freq: Two times a day (BID) | ORAL | 0 refills | Status: AC
Start: 2023-11-24 — End: 2023-11-29

## 2023-11-24 MED ORDER — OSELTAMIVIR PHOSPHATE 75 MG PO CAPS *I*
75.0000 mg | ORAL_CAPSULE | Freq: Two times a day (BID) | ORAL | 0 refills | Status: AC
Start: 2023-11-24 — End: ?

## 2023-11-24 MED ORDER — POTASSIUM CHLORIDE CR 10 MEQ PO CPCR *I*
20.0000 meq | ORAL_CAPSULE | Freq: Once | ORAL | Status: AC
  Administered 2023-11-24: 20 meq via ORAL
  Filled 2023-11-24: qty 2

## 2023-11-24 MED ORDER — LACTATED RINGERS IV BOLUS *I*
20.0000 mL/kg | Freq: Once | INTRAVENOUS | Status: AC
Start: 2023-11-24 — End: 2023-11-24
  Administered 2023-11-24: 1500 mL via INTRAVENOUS

## 2023-11-24 NOTE — Bed Hold Note (Signed)
Bed: TED-01  Expected date:   Expected time:   Means of arrival:   Comments:  amb

## 2023-11-24 NOTE — ED Provider Notes (Addendum)
 History   No chief complaint on file.    73 year old history of COPD, currently living in [place], visiting the area, presenting with shortness of breath and bodyaches and cough for about 2 days.  She tripped in [place] on Wednesday, began to have symptoms about 2 days later.  Unknown sick contacts.  She used her nebulizer (brought from [place]) with continued symptoms.  Patient denies unilateral leg pain or swelling but does report bilateral lower extremity pain.  Denies any chest pain.  She reports reduced intake.  She was up all coughing for more short of breath this morning called EMS.  En route she received oxygen due to pulse ox of 86 as well as nebulizer.  She is feeling improved but tachycardic.        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    Incontinence of urine     Other pulmonary insufficiency, not elsewhere classified 06/06/2016        Patient Active Problem List   Diagnosis Code    Other pulmonary insufficiency, not elsewhere classified J98.4    GERD (gastroesophageal reflux disease) K21.9    Arthritis M19.90    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    Cramps of left lower extremity R25.2    Hyperglycemia R73.9    Hyperlipidemia E78.5    Insomnia G47.00    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    Mitral valve stenosis I05.0    Headache R51.9    Elevated blood pressure reading without diagnosis of hypertension R03.0    Incontinence of urine R32            Past Surgical History:   Procedure Laterality Date    ANKLE SURGERY  2013    Fracture on the right crush injury on the left    CHOLECYSTECTOMY  2021    DENTAL SURGERY      esophageal dilatation      EYE SURGERY Left 10/02/2018    cataracts and astigmatism    FOOT SURGERY Left 2011    KNEE SURGERY Left     3 times    LUNG REMOVAL, PARTIAL      PR  THORACOSCOPY W/THERA WEDGE RESEXN INITIAL UNILAT Right 06/16/2016    Procedure: THORACOSCOPY (VATS) Wedge;  Surgeon: Barrett Henle, MD;  Location: [facility] MAIN OR;  Service: Other    TONSILLECTOMY      TUBAL LIGATION      pp btl @ umbilicus          Social History[1]          Review of Systems   Constitutional:  Positive for activity change.   Respiratory:  Positive for cough and shortness of breath.    Cardiovascular:  Negative for chest pain.   Musculoskeletal:  Negative for back pain.   Neurological:  Negative for dizziness.       Physical Exam     Triage Vitals     First Recorded  ,    .      Physical Exam  Vitals reviewed.   Constitutional:       General: She is not in acute distress.     Appearance: She is well-developed. She is not ill-appearing or toxic-appearing.   Eyes:  General: No scleral icterus.     Extraocular Movements: Extraocular movements intact.   Cardiovascular:      Rate and Rhythm: Normal rate.   Pulmonary:      Effort: Pulmonary effort is normal.      Breath sounds: Wheezing present.   Abdominal:      Palpations: Abdomen is soft.   Skin:     General: Skin is warm.      Capillary Refill: Capillary refill takes less than 2 seconds.      Coloration: Skin is not jaundiced.   Neurological:      General: No focal deficit present.      Mental Status: She is alert.   Psychiatric:         Mood and Affect: Mood normal.       Medical Decision Making     Assessment:  73 year old traveling from [place] with history of COPD presenting with 2-day history of cough congestion and shortness of breath.  She was reportedly hypoxic at home when EMS arrived, traveling with her nebulizers.  She denies chest pain.  She reports leg aches but no unilateral leg pain or swelling.  She has no prior history of pulmonary embolism or DVT.  No known sick contacts with flu.    Differential diagnosis:  Influenza most likely, COPD exacerbation, less likely pneumonia    Plan:  Orders Placed This Encounter      COVID/Influenza A  & B/RSV NAAT (PCR)      *Chest STANDARD single view      CBC and differential      Comprehensive metabolic panel      Hold blue      Performing Lab      Initiate COVID precautions      Initiate droplet isolation      EKG 12 lead (initial)      Insert peripheral IV        EKG Interpretation:  Sinus tachycardia, no evidence of U waves, tracing reviewed by myself    Review of existing & external labs / records: Influenza A positive, hypokalemic but likely due to recent nebulizers given by EMS, probably combined with poor p.o. intake.    Independent interpretation of imaging: No acute process    Consideration of hospitalization: Not currently indicated    Other MDM elements: IV supplementation of potassium with lactated Ringer's, oral potassium, magnesium    ED Course and Disposition:  Prescription provided for potassium.  First dose of Tamiflu in ED, prescription provided.  Heart rate improved during ED course.  She is not hypoxic on room air.  I do not see an infiltrate on chest x-ray therefore she is appropriate for clinical discharge.     A concomitant pulmonary embolism seems extremely unlikely in the setting of influenza A which would otherwise explain her symptoms           Raliegh Ip, MD          Author:  Raliegh Ip, MD       Raliegh Ip, MD  11/24/23 1155     Raliegh Ip, MD  11/24/23 1156         [1]   Social History  Tobacco Use    Smoking status: Former     Packs/day: 0.75     Years: 37.00     Additional pack years: 0.00     Total pack years: 27.75     Types: Cigarettes     Quit  date: 09/19/1999     Years since quitting: 24.1    Smokeless tobacco: Never   Substance Use Topics    Alcohol use: Yes    Drug use: No        Raliegh Ip, MD  11/24/23 1156

## 2023-11-24 NOTE — ED Notes (Signed)
 EKG given to Dr. Tobe Sos

## 2023-11-24 NOTE — ED Triage Notes (Addendum)
 Pt comes to ED via ambulance with flu like symptoms that started on Wednesday night after a flight from [place].  Pt has had poor PO intake.  Pt has cough and O2 sat 86% RA  Pt on 6L mask neb and now 99%

## 2023-11-25 LAB — EKG 12-LEAD
P: 66 deg
PR: 149 ms
QRS: -45 deg
QRSD: 89 ms
QT: 323 ms
QTc: 464 ms
Rate: 124 {beats}/min
T: -73 deg

## 2023-11-27 ENCOUNTER — Emergency Department: Payer: Medicare (Managed Care)

## 2023-11-27 ENCOUNTER — Emergency Department
Admission: EM | Admit: 2023-11-27 | Discharge: 2023-11-27 | Disposition: A | Discharge status: Home / Self Care | Payer: Medicare (Managed Care) | Source: Ambulatory Visit | Attending: Emergency Medicine | Admitting: Emergency Medicine

## 2023-11-27 DIAGNOSIS — E860 Dehydration: Secondary | ICD-10-CM

## 2023-11-27 DIAGNOSIS — J101 Influenza due to other identified influenza virus with other respiratory manifestations: Secondary | ICD-10-CM | POA: Insufficient documentation

## 2023-11-27 DIAGNOSIS — Z87891 Personal history of nicotine dependence: Secondary | ICD-10-CM | POA: Insufficient documentation

## 2023-11-27 DIAGNOSIS — J449 Chronic obstructive pulmonary disease, unspecified: Secondary | ICD-10-CM

## 2023-11-27 DIAGNOSIS — M7989 Other specified soft tissue disorders: Secondary | ICD-10-CM

## 2023-11-27 DIAGNOSIS — R059 Cough, unspecified: Secondary | ICD-10-CM

## 2023-11-27 DIAGNOSIS — M79605 Pain in left leg: Secondary | ICD-10-CM

## 2023-11-27 DIAGNOSIS — I517 Cardiomegaly: Secondary | ICD-10-CM

## 2023-11-27 LAB — MAGNESIUM: Magnesium: 1.9 mg/dL (ref 1.6–2.5)

## 2023-11-27 LAB — CBC AND DIFFERENTIAL
Baso # K/uL: 0 10*3/uL (ref 0.0–0.2)
Eos # K/uL: 0 10*3/uL (ref 0.0–0.5)
Hematocrit: 40 % (ref 34–49)
Hemoglobin: 12.7 g/dL (ref 11.2–16.0)
IMM Granulocytes #: 0 10*3/uL (ref 0.0–0.0)
IMM Granulocytes: 0.2 %
Lymph # K/uL: 0.7 10*3/uL — ABNORMAL LOW (ref 1.0–5.0)
MCV: 84 fL (ref 75–100)
Mono # K/uL: 0.3 10*3/uL (ref 0.1–1.0)
Neut # K/uL: 3 10*3/uL (ref 1.5–6.5)
Nucl RBC # K/uL: 0 10*3/uL (ref 0.0–0.0)
Nucl RBC %: 0 /100{WBCs} (ref 0.0–0.2)
Platelets: 149 10*3/uL — ABNORMAL LOW (ref 150–450)
RBC: 4.7 MIL/uL (ref 4.0–5.5)
RDW: 14.2 % (ref 0.0–15.0)
Seg Neut %: 74.1 %
WBC: 4.1 10*3/uL (ref 3.5–11.0)

## 2023-11-27 LAB — COMPREHENSIVE METABOLIC PANEL
ALT: 30 U/L (ref 0–35)
AST: 60 U/L — ABNORMAL HIGH (ref 0–35)
Albumin: 3.4 g/dL — ABNORMAL LOW (ref 3.5–5.2)
Alk Phos: 115 U/L — ABNORMAL HIGH (ref 35–105)
Anion Gap: 12 (ref 7–16)
Bilirubin,Total: 0.3 mg/dL (ref 0.0–1.2)
CO2: 24 mmol/L (ref 20–28)
Calcium: 8.3 mg/dL — ABNORMAL LOW (ref 8.6–10.2)
Chloride: 103 mmol/L (ref 96–108)
Creatinine: 0.68 mg/dL (ref 0.51–0.95)
Glucose: 89 mg/dL (ref 60–99)
Lab: 13 mg/dL (ref 6–20)
Potassium: 3.7 mmol/L (ref 3.3–4.6)
Sodium: 139 mmol/L (ref 133–145)
Total Protein: 6.1 g/dL — ABNORMAL LOW (ref 6.3–7.7)
eGFR BY CREAT: 91 *

## 2023-11-27 LAB — VENOUS BLOOD GAS
Base Excess,VENOUS: -2 mmol/L (ref <=2)
Bicarbonate,VENOUS: 25 mmol/L (ref 21–28)
CO2 (Calc),VENOUS: 27 mmol/L (ref 22–31)
CO: 0.3 %
FO2 HB,VENOUS: 29 % — ABNORMAL LOW (ref 63–83)
Hemoglobin: 12.8 g/dL (ref 11.2–16.0)
Methemoglobin: 0.3 % (ref 0.0–1.0)
PCO2,VENOUS: 52 mmHg — ABNORMAL HIGH (ref 40–50)
PH,VENOUS: 7.29 — ABNORMAL LOW (ref 7.32–7.42)
PO2,VENOUS: <30 mmHg (ref 25–43)

## 2023-11-27 LAB — LACTATE, PLASMA: Lactate: 0.9 mmol/L (ref 0.5–2.2)

## 2023-11-27 MED ORDER — ONDANSETRON 4 MG PO TBDP *I*
4.0000 mg | ORAL_TABLET | Freq: Three times a day (TID) | ORAL | 0 refills | Status: AC | PRN
Start: 2023-11-27 — End: ?

## 2023-11-27 MED ORDER — SODIUM CHLORIDE 0.9 % IV BOLUS *I*
1000.0000 mL | Freq: Once | Status: AC
Start: 2023-11-27 — End: 2023-11-27
  Administered 2023-11-27: 1000 mL via INTRAVENOUS

## 2023-11-27 NOTE — Bed Hold Note (Signed)
 Bed: TED-03  Expected date: 11/27/23  Expected time: 11:27 AM  Means of arrival:   Comments:  EMS: HF Flu s/sx

## 2023-11-27 NOTE — ED Notes (Signed)
 EKG completed; received by Dr. Earl Gala

## 2023-11-27 NOTE — ED Triage Notes (Addendum)
 Patient coming to ED  w/ flu s/sx. Patient was diagnosed w/ the flu on 11/24/23. Patient was prescribed tamiflu. Patient having nausea, poor PO intake, whjeezing and a cough.  Endorses SOB.  Patient given nebulizer treatment in route x2, 10 mg of decadron, 4mg  of zofran and 700 mL of fluids. O2 was 87% on RA per EMS.     Prehospital medications given: No

## 2023-11-27 NOTE — ED Provider Notes (Signed)
 History     Chief Complaint   Patient presents with    URI    Shortness of Breath     73 year old female with a history of COPD, GERD, recent diagnosis of influenza, onset of symptoms approximately 6 days ago, with diagnosis and evaluation in this emergency department 3 days ago presents with persistent shortness of breath, cough.  She reports poor appetite and decreased oral intake, with nausea and vomiting which resolved yesterday, diarrhea 2 days ago which is also resolved.  She has been taking Tamiflu.  She reports persistent, nonproductive cough, with chest pain when coughing.  Mild shortness of breath    Received Decadron, DuoNeb en route via EMS.    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    Incontinence of urine     Other pulmonary insufficiency, not elsewhere classified 06/06/2016        Patient Active Problem List   Diagnosis Code    Other pulmonary insufficiency, not elsewhere classified J98.4    GERD (gastroesophageal reflux disease) K21.9    Arthritis M19.90    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    Cramps of left lower extremity R25.2    Hyperglycemia R73.9    Hyperlipidemia E78.5    Insomnia G47.00    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    Mitral valve stenosis I05.0    Headache R51.9    Elevated blood pressure reading without diagnosis of hypertension R03.0    Incontinence of urine R32            Past Surgical History:   Procedure Laterality Date    ANKLE SURGERY  2013    Fracture on the right crush injury on the left    CHOLECYSTECTOMY  2021    DENTAL SURGERY      esophageal dilatation      EYE SURGERY Left 10/02/2018    cataracts and astigmatism    FOOT SURGERY Left 2011    KNEE SURGERY Left     3 times    LUNG  REMOVAL, PARTIAL      PR THORACOSCOPY W/THERA WEDGE RESEXN INITIAL UNILAT Right 06/16/2016    Procedure: THORACOSCOPY (VATS) Wedge;  Surgeon: Barrett Henle, MD;  Location: [facility] MAIN OR;  Service: Other    TONSILLECTOMY      TUBAL LIGATION      pp btl @ umbilicus          Social History[1]          Review of Systems   Constitutional:  Positive for chills and fever (Subjective).   Gastrointestinal:  Positive for diarrhea, nausea and vomiting. Negative for abdominal pain.       Physical Exam     Triage Vitals  Triage Start: Start, (11/27/23 1134)  First Recorded BP: (!) 156/102, Resp: 16, Temp: 36.1 C (97 F) Oxygen Therapy SpO2: 99 %, O2 Device: None (Room air), Heart Rate: 89, (11/27/23 1136)  .      Physical Exam  Vitals (No hypoxia, afebrile here, no) and nursing note reviewed.   Constitutional:       General: She is not  in acute distress.     Appearance: She is not ill-appearing.   HENT:      Head: Normocephalic.   Eyes:      Extraocular Movements: Extraocular movements intact.      Pupils: Pupils are equal, round, and reactive to light.   Cardiovascular:      Rate and Rhythm: Normal rate and regular rhythm.   Pulmonary:      Effort: Pulmonary effort is normal.      Breath sounds: Wheezing (Mild diffuse wheezing, no consolidation) present.   Abdominal:      Palpations: Abdomen is soft.   Musculoskeletal:      Cervical back: Normal range of motion and neck supple.      Right lower leg: No edema.      Left lower leg: Tenderness (Left posterior calf) present. No edema.   Skin:     General: Skin is warm and dry.      Capillary Refill: Capillary refill takes less than 2 seconds.   Neurological:      General: No focal deficit present.      Mental Status: She is oriented to person, place, and time.   Psychiatric:         Mood and Affect: Mood normal.         Behavior: Behavior normal.         Medical Decision Making     Assessment:  74 year old female with history of COPD, recent diagnosis influenza presents with  persistent cough, shortness of breath, left lower extremity pain and poor oral intake with resolved diarrhea.  Currently taking Tamiflu, no documented fevers but subjective fever last night    Differential diagnosis:  Influenza, dehydration, electrolyte abnormality, post influenza pneumonia, DVT    Plan:  Orders Placed This Encounter      *Chest standard frontal and lateral views      Portable US doppler vein LEFT lower extremity      Comprehensive metabolic panel      CBC and differential      Lactate, plasma      Venous blood gas      Magnesium      Vital signs      EKG 12 lead      Insert peripheral IV       EKG Interpretation:  Tracing reviewed by myself, no ischemic changes    Review of existing & external labs / records: Reviewed recent ED visit    Independent interpretation of imaging: Chest x-ray NAD    Consideration of hospitalization: Not indicated based on reassuring workup, lack of hypoxia, may proceed with outpatient treatment.    Other MDM elements: Discussion regarding single dose Decadron therapy continued use of DuoNebs and completion of Tamiflu therapy.  Prescription for antiemetic.    ED Course and Disposition:  VBG showed minimal acidosis and CO2 retention.  Chest x-ray was reassuring.  There is no significant leukocytosis nor evidence of kidney injury, electrolyte derangement.  A lactate was normal.  The patient had no evidence of hypoxia or increased work of breathing throughout her ED stay.  At this point she was determined to be stable for discharge, with return precautions advised.      ED Course as of 12/05/23 1416   Tue Nov 27, 2023   1224 EKG 12 lead  Sinus rhythm, no acute ischemia tachycardia noted 3 days ago on EKG has resolved.   1341 *Chest standard frontal and lateral views  No acute disease  1356 Portable US doppler vein LEFT lower extremity  No evidence of DVT   1517 Updated, no hypoxia, no increased work of breathing.  She is already seen for long-acting dexamethasone via  EMS    Have updated to reassuring results, will prescribe ondansetron for nausea and vomiting control.       Lytle Michaels, MD            [1]   Social History  Tobacco Use    Smoking status: Former     Packs/day: 0.75     Years: 37.00     Additional pack years: 0.00     Total pack years: 27.75     Types: Cigarettes     Quit date: 09/19/1999     Years since quitting: 24.2    Smokeless tobacco: Never   Substance Use Topics    Alcohol use: Yes    Drug use: No        Lytle Michaels, MD  12/05/23 1419

## 2023-11-27 NOTE — Discharge Instructions (Signed)
 Drink plenty of fluids    Continue inhalers and nebulizers as previously prescribed.    Your chest x-ray and laboratory evaluation was reassuring in the emergency department.    Use ondansetron as prescribed for any nausea and vomiting

## 2023-11-28 LAB — EKG 12-LEAD
P: 78 deg
PR: 168 ms
QRS: -57 deg
QRSD: 94 ms
QT: 398 ms
QTc: 488 ms
Rate: 90 {beats}/min
T: -82 deg

## 2023-12-01 ENCOUNTER — Other Ambulatory Visit: Payer: Self-pay

## 2023-12-01 LAB — TROPONIN I, HS, 0 HR: TROPONIN I, HS, 0 HR: 3 pg/mL (ref 0–50)

## 2023-12-01 LAB — NEUTROPHILIA SCAN

## 2023-12-01 LAB — CBC AND DIFFERENTIAL
Baso # K/uL: 0 10*3/uL (ref 0.0–0.2)
Basophil %: 0 % (ref 0–3)
Eos # K/uL: 0 10*3/uL (ref 0.0–0.6)
Eosinophil %: 0 % (ref 0–5)
Hematocrit: 41 % (ref 35–47)
Hemoglobin: 13.1 g/dL (ref 12.0–16.0)
Lymph # K/uL: 0.3 10*3/uL — ABNORMAL LOW (ref 1.0–4.8)
Lymphocyte %: 4 % — ABNORMAL LOW (ref 15–45)
MCH: 26.1 pg (ref 26.0–34.0)
MCHC: 32.3 g/dL (ref 31.0–37.5)
MCV: 81 fL (ref 80–100)
Mono # K/uL: 0.2 10*3/uL (ref 0.1–1.0)
Monocyte %: 3 % (ref 0–15)
Neut # K/uL: 7 10*3/uL (ref 1.8–8.0)
Platelets: 248 10*3/uL (ref 150–450)
RBC: 5.01 10*6/uL (ref 3.80–5.20)
RDW: 14 % (ref 0.0–15.2)
Seg Neut %: 92 % — ABNORMAL HIGH (ref 45–75)
WBC: 7.6 10*3/uL (ref 4.0–11.0)

## 2023-12-01 LAB — BASIC METABOLIC PANEL
Anion Gap: 13 mmol/L (ref 4–16)
CO2: 27 meq/L (ref 22–30)
Calcium: 8.9 mg/dL (ref 8.5–10.2)
Chloride: 99 mmol/L (ref 98–108)
Creatinine: 0.5 mg/dL (ref 0.5–0.9)
Glucose: 225 mg/dL — ABNORMAL HIGH (ref 65–100)
Lab: 7 mg/dL — ABNORMAL LOW (ref 8–20)
Potassium: 3.1 mmol/L — ABNORMAL LOW (ref 3.5–5.1)
Sodium: 139 mmol/L (ref 135–145)

## 2023-12-01 LAB — EGFR CKD-EPI REFIT: eCFR CKD-EPI REFIT: >60 mL/min

## 2023-12-01 LAB — TROPONIN I, HIGH SENS., 1 HR
TROPI, HS, DELTA 0-1 HR: 0 pg/mL (ref 0–9)
TROPONIN I, HS. 1 HR: 3 pg/mL (ref 0–50)

## 2023-12-01 LAB — NT-PRO BNP: NT-pro BNP: 23 pg/mL (ref 0–100)

## 2023-12-01 LAB — VENOUS BLOOD GAS
Base Excess,VENOUS: 0.9 meq/L (ref <=3.0)
Bicarbonate,VENOUS: 27 meq/L (ref 23–28)
PCO2,VENOUS: 49 mmHg (ref 40–52)
PH,VENOUS: 7.36 (ref 7.32–7.42)
PO2,VENOUS: 42 mmHg (ref 40–50)

## 2024-01-09 IMAGING — MG MAMMOGRAPHY SCREENING BILATERAL 3[PERSON_NAME]
8 series · 8 of 24 positions shown · non-contrast
Comparison: 12/26/2022 and 03/28/2021

________________________________________________________________________________________________ 
MAMMOGRAPHY SCREENING BILATERAL 3RUCHIKA SEGURA, 01/09/2024 [DATE]: 
CLINICAL INDICATION: Encounter for screening mammogram.
TECHNIQUE: Digital bilateral mammograms and 3-D Tomosynthesis were obtained. 
These were interpreted both primarily and with the aid of computer-aided 
detection system.  
BREAST DENSITY: (Level B) There are scattered areas of fibroglandular density.

[R CC]
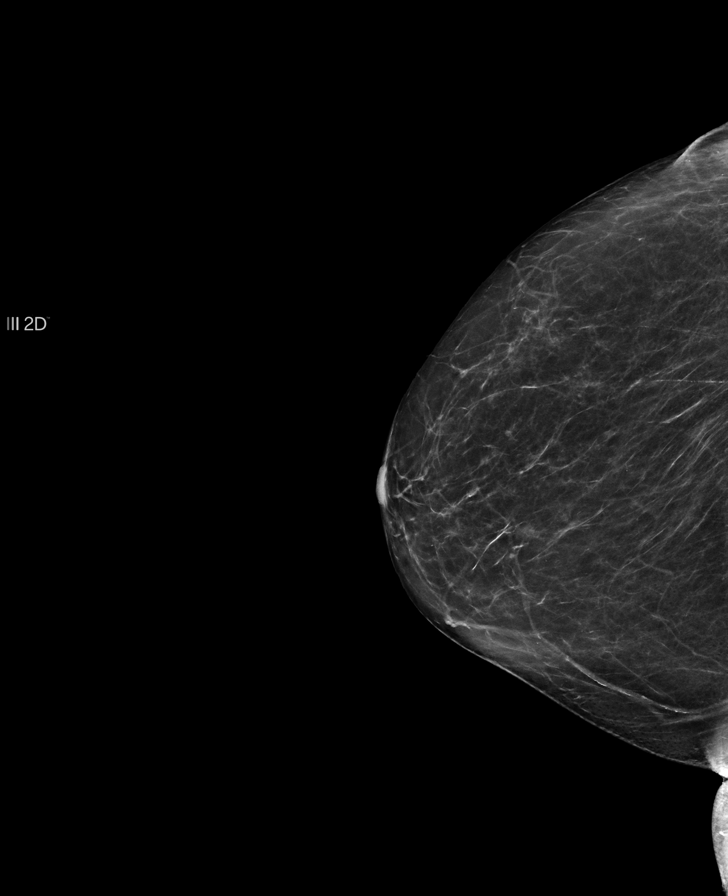

[L MLO]
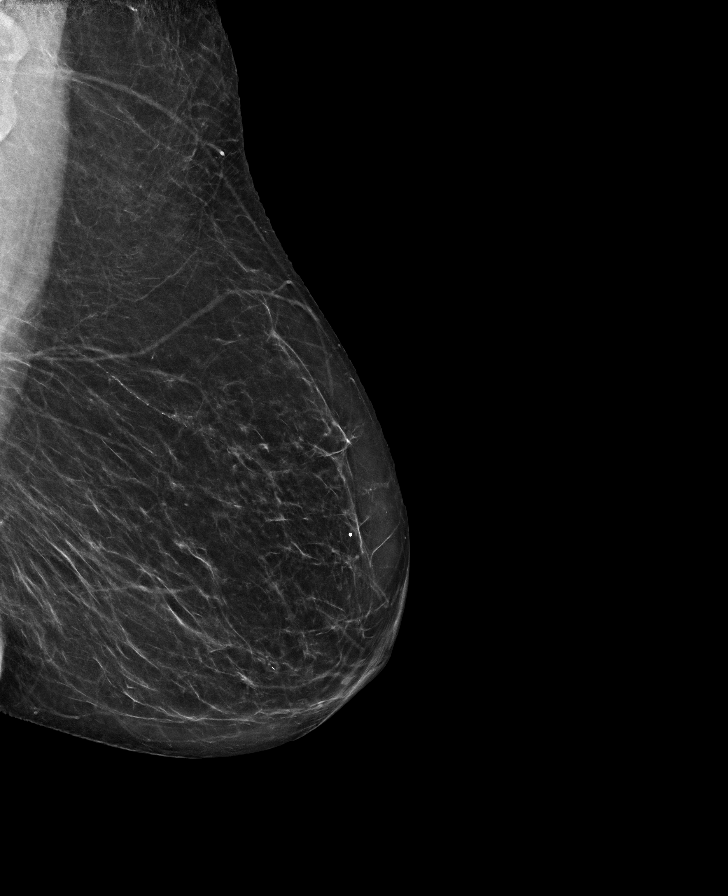

[L CC]
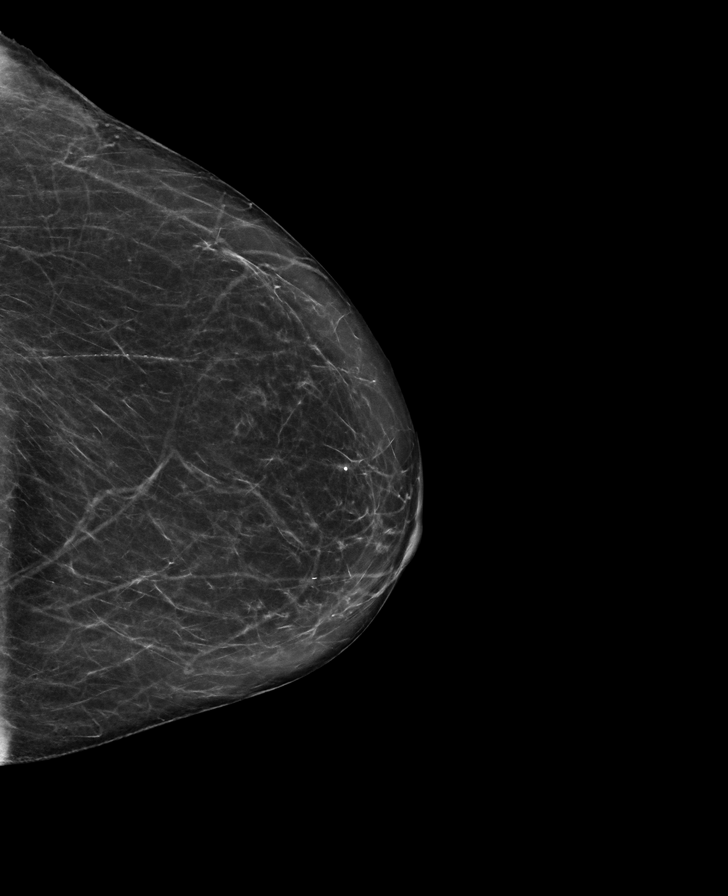

[R MLO]
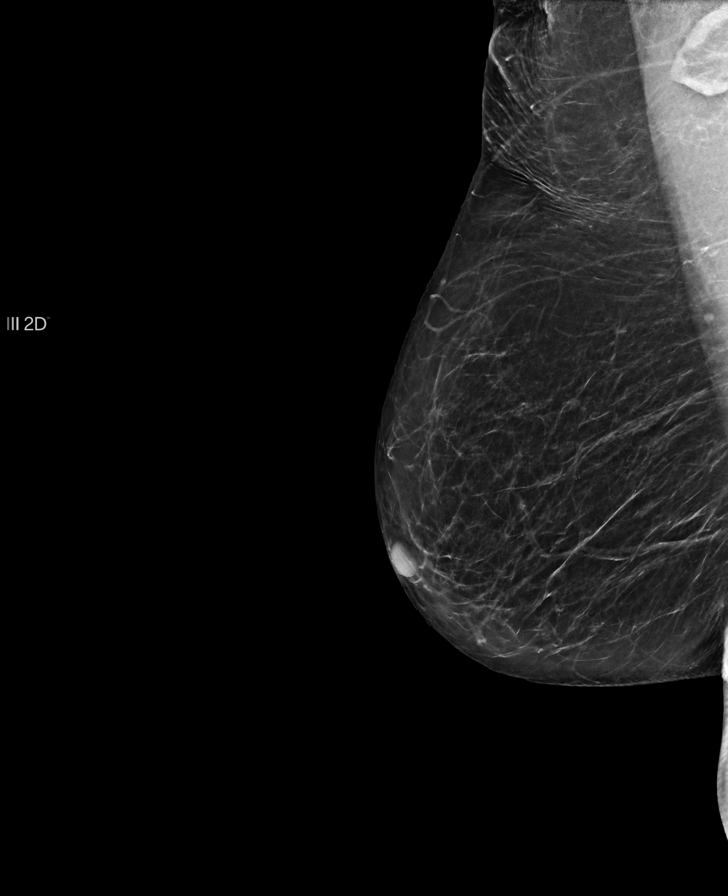

[L CC tomo · tomo slice 11/21.0]
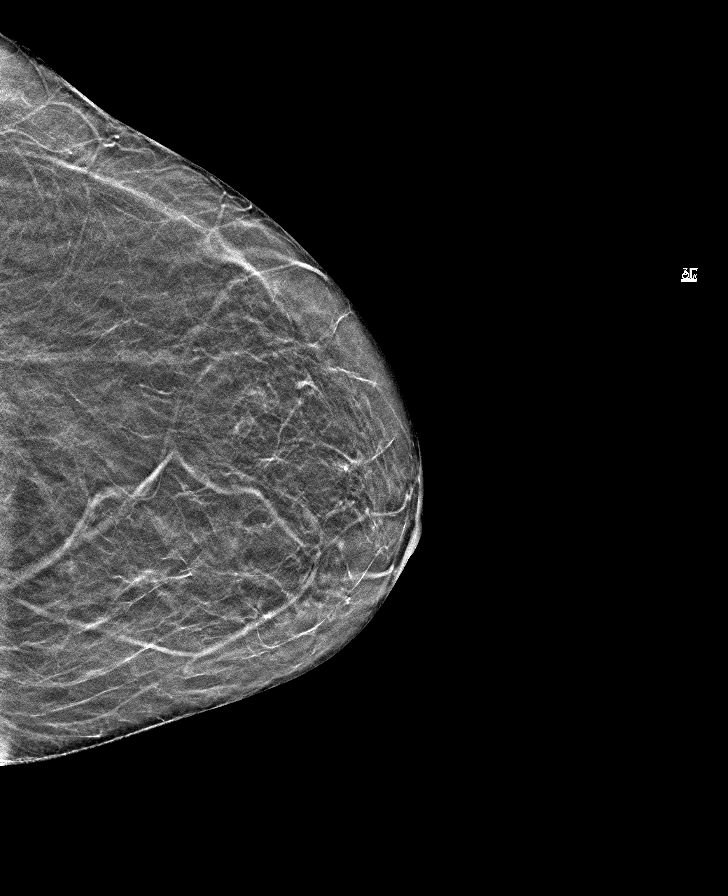

[R MLO tomo · tomo slice 11/21.0]
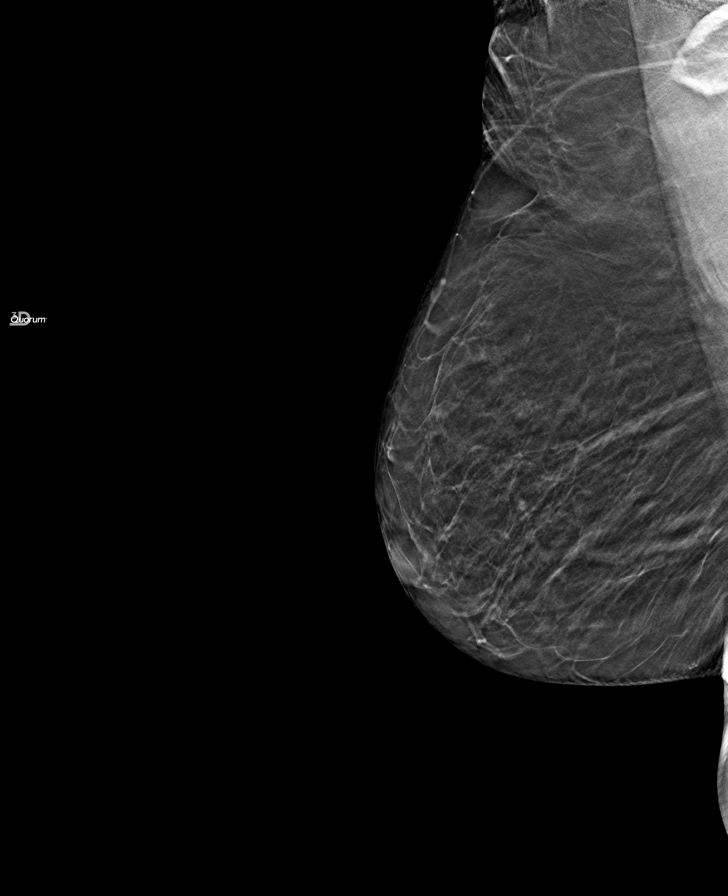

[R CC tomo · tomo slice 11/20.0]
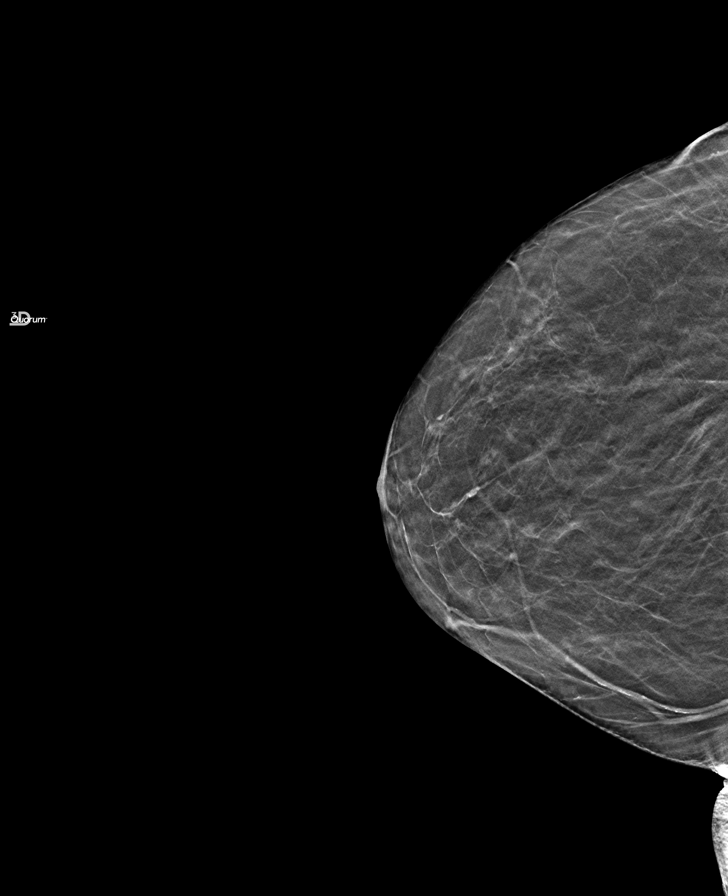

[L MLO tomo · tomo slice 11/21.0]
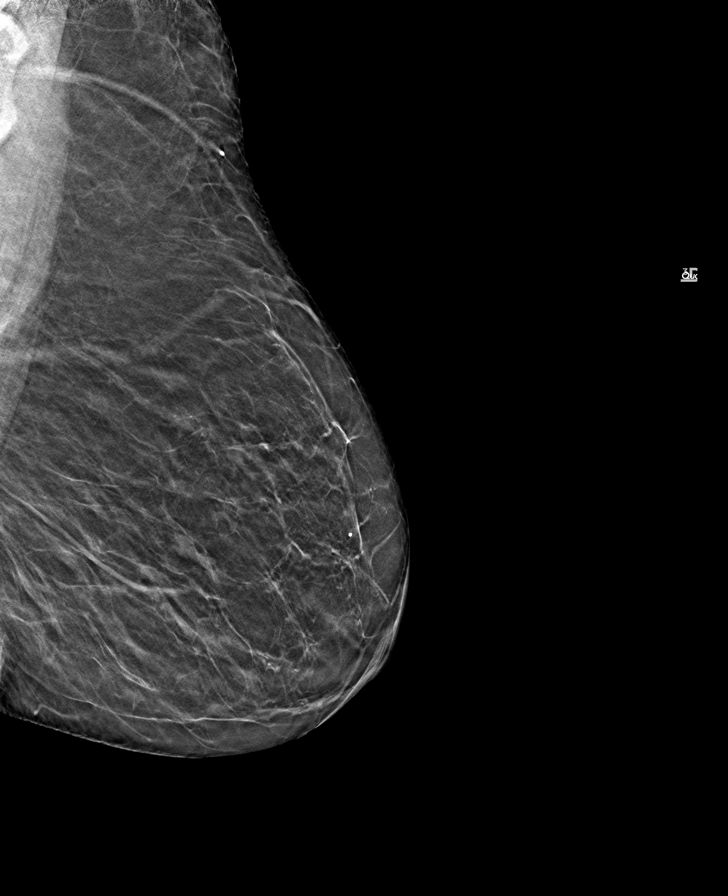

[8 of 24 positions shown; findings below may reference images not displayed]

FINDINGS: No suspicious mass, calcifications, or area of architectural 
distortion in either breast. Overall stable mammographic appearance.
IMPRESSION: No mammographic findings suggestive for malignancy. 
(BI-RADS 2) Benign findings. Routine mammographic follow-up is recommended.

## 2024-01-09 IMAGING — DX LUMBAR SPINE 2 VIEW
2 series · 2 of 2 positions shown · non-contrast
Comparison: Lumbar spine x-ray December 26, 2022, MRI lumbar spine December 07, 2022.

________________________________________________________________________________________________ 
LUMBAR SPINE 2 VIEW, 01/09/2024 [DATE]: 
CLINICAL INDICATION: Paresthesia Of Skin, low back pain.

[AP]
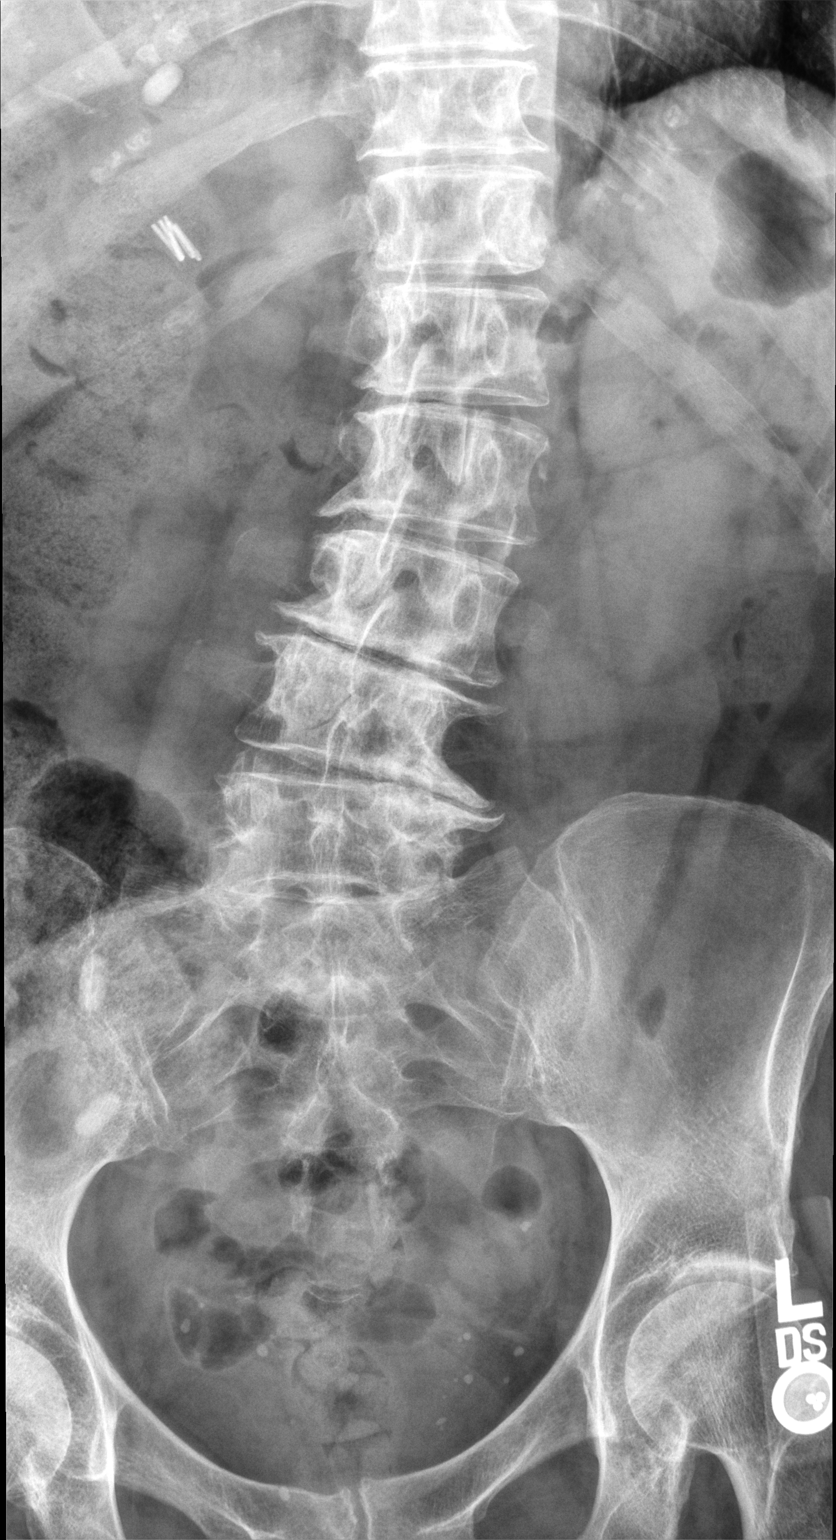

[lateral]
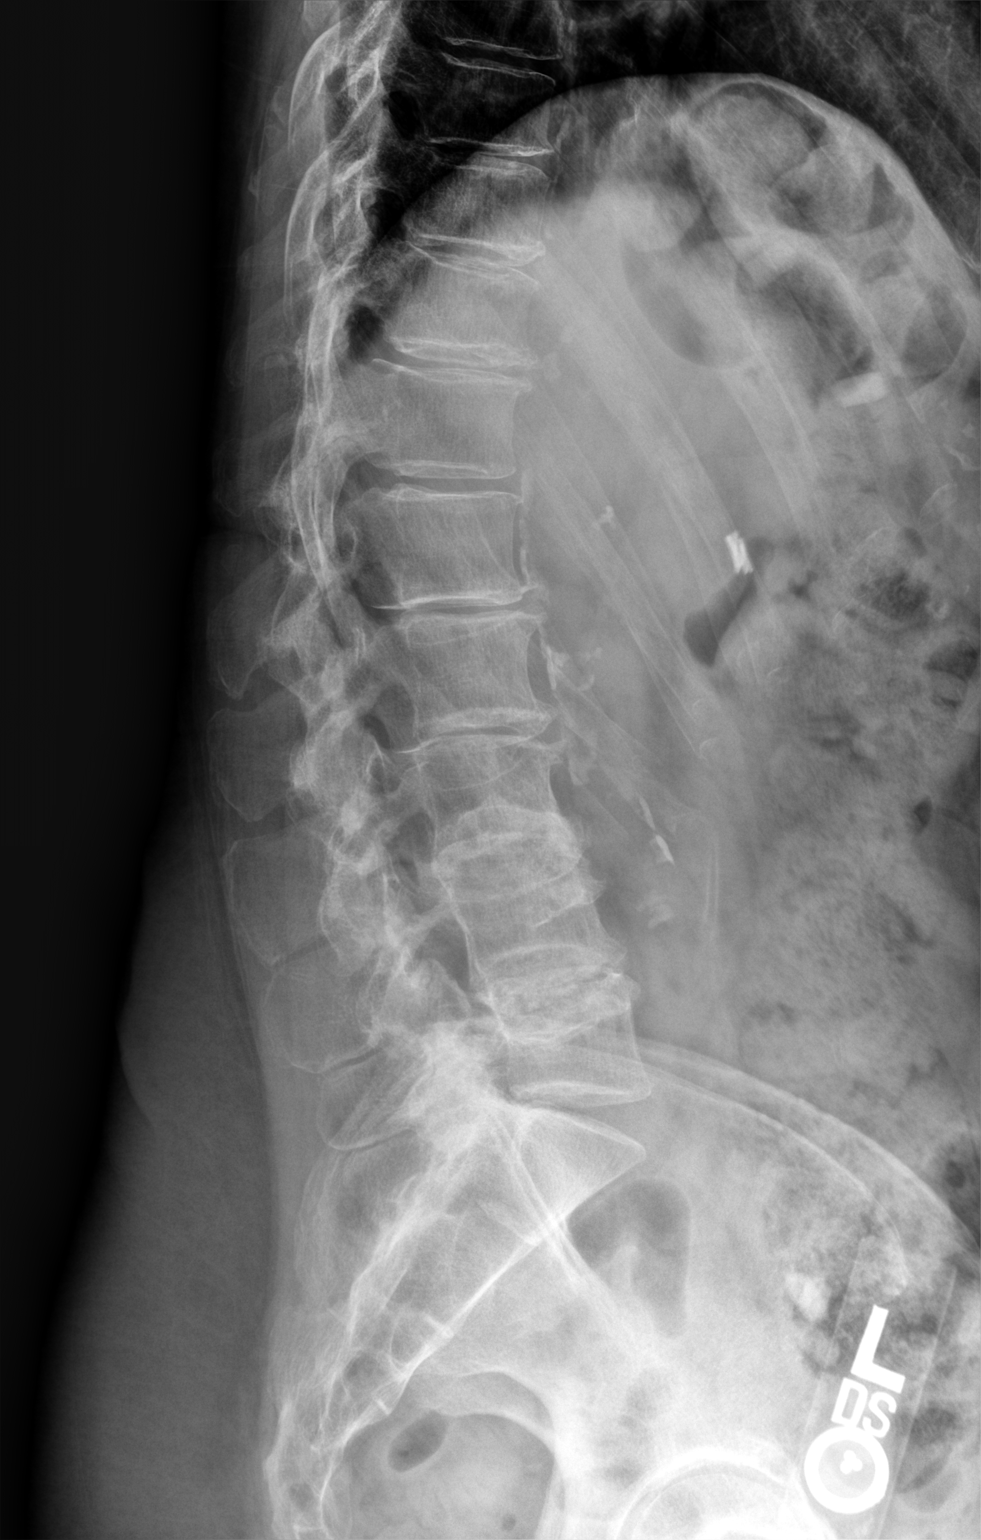

[2 of 2 positions shown; findings below may reference images not displayed]

FINDINGS: 5 lumbar type vertebral bodies. Stable mild levoconvex thoracic lumbar 
scoliosis. Right upper quadrant surgical clips. There are radiopaque tablets 
noted in the right lower quadrant with pelvic phleboliths also noted. Loss of 
the normal lumbar lordosis with straightening of the lumbar alignment and grade 
1 retrolisthesis L5 on S1. Osteopenia. Vertebral body height appears preserved 
without fracture. Multilevel DDD changes with vacuum disc phenomenon are stable. 
Atherosclerotic changes.
IMPRESSION: Stable appearing degenerative and scoliotic changes. Consider MRI to further 
assess. 
Osteopenia. Advise further assessment with DEXA with trabecular bone score, if 
not already performed.

## 2024-06-27 ENCOUNTER — Encounter: Payer: Self-pay | Admitting: Internal Medicine

## 8224-06-18 DEATH — deceased
